# Patient Record
Sex: Male | Born: 1955 | Race: Black or African American | Hispanic: No | Marital: Married | Smoking: Former smoker
Health system: Southern US, Community
[De-identification: ages and names within clinical notes are randomized; demographics above are authoritative.]

## PROBLEM LIST (undated history)

## (undated) DIAGNOSIS — E119 Type 2 diabetes mellitus without complications: Secondary | ICD-10-CM

## (undated) DIAGNOSIS — K5710 Diverticulosis of small intestine without perforation or abscess without bleeding: Secondary | ICD-10-CM

## (undated) DIAGNOSIS — Z86010 Personal history of colonic polyps: Secondary | ICD-10-CM

## (undated) DIAGNOSIS — N400 Enlarged prostate without lower urinary tract symptoms: Secondary | ICD-10-CM

## (undated) DIAGNOSIS — K2980 Duodenitis without bleeding: Secondary | ICD-10-CM

## (undated) DIAGNOSIS — D649 Anemia, unspecified: Secondary | ICD-10-CM

## (undated) DIAGNOSIS — K3580 Unspecified acute appendicitis: Secondary | ICD-10-CM

## (undated) DIAGNOSIS — D509 Iron deficiency anemia, unspecified: Secondary | ICD-10-CM

## (undated) DIAGNOSIS — C61 Malignant neoplasm of prostate: Secondary | ICD-10-CM

## (undated) DIAGNOSIS — E7800 Pure hypercholesterolemia, unspecified: Secondary | ICD-10-CM

## (undated) DIAGNOSIS — K627 Radiation proctitis: Secondary | ICD-10-CM

## (undated) DIAGNOSIS — Z862 Personal history of diseases of the blood and blood-forming organs and certain disorders involving the immune mechanism: Secondary | ICD-10-CM

## (undated) DIAGNOSIS — N529 Male erectile dysfunction, unspecified: Secondary | ICD-10-CM

## (undated) DIAGNOSIS — I10 Essential (primary) hypertension: Secondary | ICD-10-CM

## (undated) HISTORY — DX: Type 2 diabetes mellitus without complications: E11.9

## (undated) HISTORY — DX: Essential (primary) hypertension: I10

## (undated) HISTORY — DX: Malignant neoplasm of prostate: C61

## (undated) HISTORY — DX: Unspecified acute appendicitis: K35.80

## (undated) HISTORY — PX: OTHER SURGICAL HISTORY: SHX169

---

## 2008-10-21 ENCOUNTER — Emergency Department: Payer: Self-pay | Admitting: Internal Medicine

## 2010-09-03 ENCOUNTER — Emergency Department: Payer: Self-pay | Admitting: Emergency Medicine

## 2010-12-17 ENCOUNTER — Emergency Department: Payer: Self-pay | Admitting: Emergency Medicine

## 2011-03-30 ENCOUNTER — Emergency Department: Payer: Self-pay | Admitting: *Deleted

## 2011-06-20 ENCOUNTER — Emergency Department: Payer: Self-pay | Admitting: Emergency Medicine

## 2011-10-01 ENCOUNTER — Emergency Department: Payer: Self-pay | Admitting: Emergency Medicine

## 2012-03-19 ENCOUNTER — Emergency Department: Payer: Self-pay | Admitting: Emergency Medicine

## 2012-03-22 ENCOUNTER — Emergency Department: Payer: Self-pay | Admitting: Emergency Medicine

## 2012-04-19 ENCOUNTER — Ambulatory Visit: Payer: Self-pay | Admitting: Emergency Medicine

## 2012-04-19 LAB — DOT URINE DIP
Glucose,UR: NEGATIVE mg/dL (ref 0–75)
Specific Gravity: 1.025 (ref 1.003–1.030)

## 2012-07-13 ENCOUNTER — Ambulatory Visit: Payer: Self-pay | Admitting: Family Medicine

## 2012-07-13 LAB — DOT URINE DIP
Glucose,UR: NEGATIVE mg/dL (ref 0–75)
Protein: NEGATIVE
Specific Gravity: >=1.03 (ref 1.003–1.030)

## 2013-01-21 ENCOUNTER — Emergency Department: Payer: Self-pay | Admitting: Emergency Medicine

## 2013-01-21 LAB — COMPREHENSIVE METABOLIC PANEL
Anion Gap: 8 (ref 7–16)
BUN: 17 mg/dL (ref 7–18)
Calcium, Total: 9.3 mg/dL (ref 8.5–10.1)
Chloride: 95 mmol/L — ABNORMAL LOW (ref 98–107)
Co2: 24 mmol/L (ref 21–32)
EGFR (African American): >60
EGFR (Non-African Amer.): >60
Glucose: 704 mg/dL (ref 65–99)
Osmolality: 290 (ref 275–301)
Potassium: 4.5 mmol/L (ref 3.5–5.1)
SGOT(AST): 21 U/L (ref 15–37)
SGPT (ALT): 34 U/L (ref 12–78)
Sodium: 127 mmol/L — ABNORMAL LOW (ref 136–145)

## 2013-01-21 LAB — CBC
HCT: 42 % (ref 40.0–52.0)
MCH: 26.6 pg (ref 26.0–34.0)
MCHC: 33.1 g/dL (ref 32.0–36.0)
MCV: 80 fL (ref 80–100)
Platelet: 324 10*3/uL (ref 150–440)
RBC: 5.22 10*6/uL (ref 4.40–5.90)
RDW: 15.5 % — ABNORMAL HIGH (ref 11.5–14.5)
WBC: 10.7 10*3/uL — ABNORMAL HIGH (ref 3.8–10.6)

## 2013-01-21 LAB — URINALYSIS, COMPLETE
Bacteria: NONE SEEN
Glucose,UR: >=500 mg/dL (ref 0–75)
Nitrite: NEGATIVE
Protein: NEGATIVE
RBC,UR: <1 /HPF (ref 0–5)
Specific Gravity: 1.029 (ref 1.003–1.030)
Squamous Epithelial: <1

## 2013-01-21 LAB — CK TOTAL AND CKMB (NOT AT ARMC)
CK, Total: 185 U/L (ref 35–232)
CK-MB: 0.9 ng/mL (ref 0.5–3.6)

## 2013-01-21 LAB — TROPONIN I: Troponin-I: <0.02 ng/mL

## 2013-02-08 HISTORY — PX: COLONOSCOPY: SHX174

## 2013-04-14 DIAGNOSIS — C61 Malignant neoplasm of prostate: Secondary | ICD-10-CM

## 2013-04-14 HISTORY — DX: Malignant neoplasm of prostate: C61

## 2013-05-09 ENCOUNTER — Ambulatory Visit: Payer: Self-pay | Admitting: Radiation Oncology

## 2013-05-15 ENCOUNTER — Ambulatory Visit: Payer: Self-pay | Admitting: Radiation Oncology

## 2013-06-08 LAB — CBC CANCER CENTER
Basophil #: 0 x10 3/mm (ref 0.0–0.1)
Basophil %: 0.5 %
Eosinophil #: 0.1 x10 3/mm (ref 0.0–0.7)
Eosinophil %: 0.9 %
HCT: 37.9 % — ABNORMAL LOW (ref 40.0–52.0)
HGB: 12 g/dL — AB (ref 13.0–18.0)
LYMPHS ABS: 1.5 x10 3/mm (ref 1.0–3.6)
LYMPHS PCT: 28 %
MCH: 25.3 pg — ABNORMAL LOW (ref 26.0–34.0)
MCHC: 31.7 g/dL — AB (ref 32.0–36.0)
MCV: 80 fL (ref 80–100)
Monocyte #: 0.4 x10 3/mm (ref 0.2–1.0)
Monocyte %: 7.2 %
NEUTROS PCT: 63.4 %
Neutrophil #: 3.4 x10 3/mm (ref 1.4–6.5)
Platelet: 405 x10 3/mm (ref 150–440)
RBC: 4.75 10*6/uL (ref 4.40–5.90)
RDW: 16.2 % — ABNORMAL HIGH (ref 11.5–14.5)
WBC: 5.4 x10 3/mm (ref 3.8–10.6)

## 2013-06-12 ENCOUNTER — Ambulatory Visit: Payer: Self-pay | Admitting: Radiation Oncology

## 2013-06-15 LAB — CBC CANCER CENTER
Basophil #: 0 x10 3/mm (ref 0.0–0.1)
Basophil %: 0.6 %
EOS PCT: 1.1 %
Eosinophil #: 0 x10 3/mm (ref 0.0–0.7)
HCT: 37.8 % — ABNORMAL LOW (ref 40.0–52.0)
HGB: 12 g/dL — AB (ref 13.0–18.0)
LYMPHS PCT: 23 %
Lymphocyte #: 0.9 x10 3/mm — ABNORMAL LOW (ref 1.0–3.6)
MCH: 25.3 pg — AB (ref 26.0–34.0)
MCHC: 31.8 g/dL — ABNORMAL LOW (ref 32.0–36.0)
MCV: 80 fL (ref 80–100)
Monocyte #: 0.3 x10 3/mm (ref 0.2–1.0)
Monocyte %: 6.8 %
Neutrophil #: 2.8 x10 3/mm (ref 1.4–6.5)
Neutrophil %: 68.5 %
PLATELETS: 329 x10 3/mm (ref 150–440)
RBC: 4.74 10*6/uL (ref 4.40–5.90)
RDW: 16.9 % — AB (ref 11.5–14.5)
WBC: 4.1 x10 3/mm (ref 3.8–10.6)

## 2013-06-22 LAB — CBC CANCER CENTER
BASOS ABS: 0 x10 3/mm (ref 0.0–0.1)
BASOS PCT: 0.4 %
EOS PCT: 1.7 %
Eosinophil #: 0.1 x10 3/mm (ref 0.0–0.7)
HCT: 37.6 % — ABNORMAL LOW (ref 40.0–52.0)
HGB: 11.9 g/dL — AB (ref 13.0–18.0)
Lymphocyte #: 0.8 x10 3/mm — ABNORMAL LOW (ref 1.0–3.6)
Lymphocyte %: 20.2 %
MCH: 25 pg — AB (ref 26.0–34.0)
MCHC: 31.6 g/dL — AB (ref 32.0–36.0)
MCV: 79 fL — ABNORMAL LOW (ref 80–100)
MONO ABS: 0.4 x10 3/mm (ref 0.2–1.0)
Monocyte %: 10.3 %
Neutrophil #: 2.6 x10 3/mm (ref 1.4–6.5)
Neutrophil %: 67.4 %
Platelet: 220 x10 3/mm (ref 150–440)
RBC: 4.76 10*6/uL (ref 4.40–5.90)
RDW: 17.1 % — AB (ref 11.5–14.5)
WBC: 3.8 x10 3/mm (ref 3.8–10.6)

## 2013-06-29 LAB — CBC CANCER CENTER
BASOS ABS: 0 x10 3/mm (ref 0.0–0.1)
BASOS PCT: 0.4 %
EOS ABS: 0.1 x10 3/mm (ref 0.0–0.7)
Eosinophil %: 2 %
HCT: 37.9 % — AB (ref 40.0–52.0)
HGB: 12 g/dL — ABNORMAL LOW (ref 13.0–18.0)
Lymphocyte #: 0.7 x10 3/mm — ABNORMAL LOW (ref 1.0–3.6)
Lymphocyte %: 20 %
MCH: 25.1 pg — ABNORMAL LOW (ref 26.0–34.0)
MCHC: 31.6 g/dL — ABNORMAL LOW (ref 32.0–36.0)
MCV: 80 fL (ref 80–100)
MONOS PCT: 12.5 %
Monocyte #: 0.5 x10 3/mm (ref 0.2–1.0)
NEUTROS ABS: 2.4 x10 3/mm (ref 1.4–6.5)
Neutrophil %: 65.1 %
PLATELETS: 193 x10 3/mm (ref 150–440)
RBC: 4.77 10*6/uL (ref 4.40–5.90)
RDW: 17.3 % — ABNORMAL HIGH (ref 11.5–14.5)
WBC: 3.7 x10 3/mm — AB (ref 3.8–10.6)

## 2013-07-13 ENCOUNTER — Ambulatory Visit: Payer: Self-pay | Admitting: Radiation Oncology

## 2013-08-22 ENCOUNTER — Ambulatory Visit: Payer: Self-pay | Admitting: Radiation Oncology

## 2013-09-02 ENCOUNTER — Ambulatory Visit: Payer: Self-pay

## 2013-09-02 LAB — DOT URINE DIP
GLUCOSE, UR: NEGATIVE mg/dL (ref 0–75)
Protein: NEGATIVE
SPECIFIC GRAVITY: 1.025 (ref 1.003–1.030)

## 2013-09-12 ENCOUNTER — Ambulatory Visit: Payer: Self-pay | Admitting: Radiation Oncology

## 2013-09-22 LAB — CBC CANCER CENTER
Basophil #: 0 x10 3/mm (ref 0.0–0.1)
Basophil %: 0.7 %
Eosinophil #: 0.1 x10 3/mm (ref 0.0–0.7)
Eosinophil %: 1.5 %
HCT: 35.4 % — ABNORMAL LOW (ref 40.0–52.0)
HGB: 11.5 g/dL — AB (ref 13.0–18.0)
Lymphocyte #: 0.9 x10 3/mm — ABNORMAL LOW (ref 1.0–3.6)
Lymphocyte %: 20.4 %
MCH: 26.4 pg (ref 26.0–34.0)
MCHC: 32.4 g/dL (ref 32.0–36.0)
MCV: 82 fL (ref 80–100)
Monocyte #: 0.4 x10 3/mm (ref 0.2–1.0)
Monocyte %: 10.4 %
NEUTROS ABS: 2.8 x10 3/mm (ref 1.4–6.5)
Neutrophil %: 67 %
Platelet: 294 x10 3/mm (ref 150–440)
RBC: 4.35 10*6/uL — AB (ref 4.40–5.90)
RDW: 17.1 % — ABNORMAL HIGH (ref 11.5–14.5)
WBC: 4.2 x10 3/mm (ref 3.8–10.6)

## 2013-09-29 LAB — CBC CANCER CENTER
BASOS PCT: 0.6 %
Basophil #: 0 x10 3/mm (ref 0.0–0.1)
Eosinophil #: 0.1 x10 3/mm (ref 0.0–0.7)
Eosinophil %: 1.4 %
HCT: 34.5 % — ABNORMAL LOW (ref 40.0–52.0)
HGB: 11.3 g/dL — ABNORMAL LOW (ref 13.0–18.0)
Lymphocyte #: 0.9 x10 3/mm — ABNORMAL LOW (ref 1.0–3.6)
Lymphocyte %: 22.3 %
MCH: 26.6 pg (ref 26.0–34.0)
MCHC: 32.7 g/dL (ref 32.0–36.0)
MCV: 81 fL (ref 80–100)
Monocyte #: 0.4 x10 3/mm (ref 0.2–1.0)
Monocyte %: 9.5 %
NEUTROS ABS: 2.6 x10 3/mm (ref 1.4–6.5)
Neutrophil %: 66.2 %
Platelet: 289 x10 3/mm (ref 150–440)
RBC: 4.24 10*6/uL — ABNORMAL LOW (ref 4.40–5.90)
RDW: 16.8 % — ABNORMAL HIGH (ref 11.5–14.5)
WBC: 3.9 x10 3/mm (ref 3.8–10.6)

## 2013-10-12 ENCOUNTER — Ambulatory Visit: Payer: Self-pay | Admitting: Radiation Oncology

## 2014-02-24 ENCOUNTER — Ambulatory Visit: Payer: Self-pay | Admitting: Radiation Oncology

## 2014-02-27 LAB — PSA: PSA: 1.7

## 2014-03-01 LAB — PSA: PSA: 1.7 ng/mL (ref 0.0–4.0)

## 2014-03-14 ENCOUNTER — Ambulatory Visit: Payer: Self-pay | Admitting: Radiation Oncology

## 2014-05-16 ENCOUNTER — Encounter: Payer: Self-pay | Admitting: General Surgery

## 2014-05-16 ENCOUNTER — Ambulatory Visit (INDEPENDENT_AMBULATORY_CARE_PROVIDER_SITE_OTHER): Payer: BLUE CROSS/BLUE SHIELD | Admitting: General Surgery

## 2014-05-16 VITALS — BP 122/78 | HR 82 | Resp 16 | Ht 72.0 in | Wt 212.0 lb

## 2014-05-16 DIAGNOSIS — K610 Anal abscess: Secondary | ICD-10-CM

## 2014-05-16 NOTE — Patient Instructions (Signed)
Follow up as scheduled.  

## 2014-05-16 NOTE — Progress Notes (Signed)
Patient ID: Luis Aguilar, male   DOB: 02/17/1956, 59 y.o.   MRN: [id number]  Chief Complaint  Patient presents with  . Other    pilonidal cyst    HPI Luis Aguilar is a 59 y.o. male. here today for a evalation of a possible infected pilonidal cyst. States the pain started Friday, no drainage noted. Started antibiotics yesterday. He has never had issues like this before.   HPI  Past Medical History  Diagnosis Date  . Hypertension   . Diabetes mellitus without complication   . Cancer 2015    prostate radiation    No past surgical history on file.  No family history on file.  Social History History  Substance Use Topics  . Smoking status: Former Smoker -- 10 years    Quit date: 04/14/2002  . Smokeless tobacco: Never Used  . Alcohol Use: No    No Known Allergies  Current Outpatient Prescriptions  Medication Sig Dispense Refill  . aspirin 81 MG tablet Take 81 mg by mouth daily.    Marland Kitchen atorvastatin (LIPITOR) 20 MG tablet Take 20 mg by mouth daily.    Marland Kitchen doxycycline (VIBRAMYCIN) 100 MG capsule Take 100 mg by mouth daily.    . enalapril (VASOTEC) 5 MG tablet Take 5 mg by mouth daily.    Marland Kitchen HYDROcodone-acetaminophen (NORCO/VICODIN) 5-325 MG per tablet Take 1 tablet by mouth every 6 (six) hours as needed for moderate pain.    . metFORMIN (GLUCOPHAGE) 500 MG tablet Take 500 mg by mouth 2 (two) times daily with a meal.    . tamsulosin (FLOMAX) 0.4 MG CAPS capsule Take 0.4 mg by mouth.     No current facility-administered medications for this visit.    Review of Systems Review of Systems  Constitutional: Negative.   Respiratory: Negative.   Cardiovascular: Negative.     Blood pressure 122/78, pulse 82, resp. rate 16, height 6' (1.829 m), weight 212 lb (96.163 kg).  Physical Exam Physical Exam  Constitutional: He is oriented to person, place, and time. He appears well-developed and well-nourished.  Eyes: Conjunctivae are normal. No scleral icterus.  Genitourinary:      Neurological: He is alert and oriented to person, place, and time.  Skin: Skin is warm and dry.    Data Reviewed Office notes  Assessment    Left perianal abscess.    Plan    Incision and drainage. Discussed with pt and he is agreeable. Procedure complated.  Betadine prep. 8 ml 1% xylocaine mixed with 0.5% marcaine used.  Cruciate incision made and 10-26ml thick pus with odor drained. C/S done. Area dressed with 4/4s.  Procedure tolerated with minimal discomfort. Pt felt pain markedly reduced after drainage.   Follow up in 2 weeks.       SANKAR,SEEPLAPUTHUR G 05/16/2014, 2:11 PM

## 2014-05-18 ENCOUNTER — Telehealth: Payer: Self-pay | Admitting: *Deleted

## 2014-05-18 NOTE — Telephone Encounter (Signed)
Called to say they need refill on pain medication, Norco 5/325mg .

## 2014-05-21 LAB — ANAEROBIC AND AEROBIC CULTURE

## 2014-05-30 ENCOUNTER — Ambulatory Visit (INDEPENDENT_AMBULATORY_CARE_PROVIDER_SITE_OTHER): Payer: BLUE CROSS/BLUE SHIELD | Admitting: General Surgery

## 2014-05-30 ENCOUNTER — Encounter: Payer: Self-pay | Admitting: General Surgery

## 2014-05-30 VITALS — BP 100/72 | HR 76 | Resp 12 | Ht 72.0 in | Wt 212.0 lb

## 2014-05-30 DIAGNOSIS — K610 Anal abscess: Secondary | ICD-10-CM

## 2014-05-30 NOTE — Progress Notes (Signed)
Patient ID: Luis Aguilar, male   DOB: 08-04-1955, 59 y.o.   MRN: [id number]  The patient presents for a 2 week follow up of a rectal abscess. He states the area is still draining a little bit. He denies pain at this time. He has completed his antibiotics and pain medication.   Mild induration noted at the 5 o'clock position. No skin opening. Overall remarkably improved. Patient advised of potential fistula.   Patient to return in 1 month for follow up.

## 2014-05-30 NOTE — Patient Instructions (Signed)
Patient to return in 1 month for follow up. The patient is aware to call back for any questions or concerns.  

## 2014-06-07 ENCOUNTER — Encounter: Payer: Self-pay | Admitting: *Deleted

## 2014-06-29 ENCOUNTER — Ambulatory Visit (INDEPENDENT_AMBULATORY_CARE_PROVIDER_SITE_OTHER): Payer: BLUE CROSS/BLUE SHIELD | Admitting: General Surgery

## 2014-06-29 ENCOUNTER — Encounter: Payer: Self-pay | Admitting: General Surgery

## 2014-06-29 DIAGNOSIS — K603 Anal fistula: Secondary | ICD-10-CM | POA: Diagnosis not present

## 2014-06-29 NOTE — Progress Notes (Deleted)
T

## 2014-06-29 NOTE — Patient Instructions (Addendum)
The patient is aware to call back for any questions or concerns.  Patient's surgery has been scheduled for 07-20-14 at [facility].

## 2014-06-29 NOTE — Progress Notes (Signed)
Patient ID: Luis Aguilar, male   DOB: 1955-10-18, 59 y.o.   MRN: [id number]  Chief Complaint  Patient presents with  . Follow-up    HPI Luis Aguilar is a 59 y.o. male he patient presents for a follow up of a rectal abscess. He states the area is still draining a little bit. He denies pain, only tenderness. He has noticed occasionally bleeding with BM.    HPI  Past Medical History  Diagnosis Date  . Hypertension   . Diabetes mellitus without complication   . Cancer 2015    prostate radiation    History reviewed. No pertinent past surgical history.  History reviewed. No pertinent family history.  Social History History  Substance Use Topics  . Smoking status: Former Smoker -- 10 years    Quit date: 04/14/2002  . Smokeless tobacco: Never Used  . Alcohol Use: No    No Known Allergies  Current Outpatient Prescriptions  Medication Sig Dispense Refill  . aspirin 81 MG tablet Take 81 mg by mouth daily.    Marland Kitchen atorvastatin (LIPITOR) 20 MG tablet Take 20 mg by mouth daily.    . enalapril (VASOTEC) 5 MG tablet Take 5 mg by mouth daily.    . metFORMIN (GLUCOPHAGE) 500 MG tablet Take 500 mg by mouth 2 (two) times daily with a meal.    . tamsulosin (FLOMAX) 0.4 MG CAPS capsule Take 0.4 mg by mouth.     No current facility-administered medications for this visit.    Review of Systems Review of Systems  Blood pressure 130/74, pulse 88, resp. rate 14, height 5\' 11"  (1.803 m), weight 213 lb (96.616 kg).  Physical Exam Physical Exam  Constitutional: He appears well-developed and well-nourished.  Eyes: Conjunctivae are normal. No scleral icterus.  Cardiovascular: Normal rate, regular rhythm and normal heart sounds.   Pulmonary/Chest: Effort normal and breath sounds normal.  Genitourinary:     Skin: Skin is warm and dry.    Data Reviewed Prior note  Assessment    Anal fistula    Plan    Discussed the nature of the problem and the surgical options. Fistulotomy  or use of fibrin plug discussed. Patient is agreeable.     Patient's surgery has been scheduled for 07-20-14 at [facility].   Evea Sheek G 07/03/2014, 3:32 PM

## 2014-07-03 ENCOUNTER — Encounter: Payer: Self-pay | Admitting: General Surgery

## 2014-07-11 ENCOUNTER — Other Ambulatory Visit: Payer: Self-pay | Admitting: General Surgery

## 2014-07-11 DIAGNOSIS — K603 Anal fistula: Secondary | ICD-10-CM

## 2014-07-17 ENCOUNTER — Ambulatory Visit
Admit: 2014-07-17 | Disposition: A | Discharge status: Home / Self Care | Payer: Self-pay | Attending: Anesthesiology | Admitting: Anesthesiology

## 2014-07-17 LAB — BASIC METABOLIC PANEL
Anion Gap: 9 (ref 7–16)
BUN: 20 mg/dL
CHLORIDE: 106 mmol/L
CREATININE: 0.9 mg/dL
Calcium, Total: 9.2 mg/dL
Co2: 24 mmol/L
EGFR (Non-African Amer.): >60
Glucose: 150 mg/dL — ABNORMAL HIGH
Potassium: 3.8 mmol/L
SODIUM: 139 mmol/L

## 2014-07-18 ENCOUNTER — Telehealth: Payer: Self-pay

## 2014-07-18 NOTE — Telephone Encounter (Signed)
Heather with pre admit testing at [facility] called and said that the patient needs medical clearance for his surgery scheduled for 07/20/14. She faxed the release form and EKG to them along with a note to his PCP Dr Sandi Raveling see if the patient could be cleared for surgery or if he would need to be seen first. I called Dr Lynden Oxford office and left a message for them to contact us if the patient was able to be cleared or not.

## 2014-07-18 NOTE — Telephone Encounter (Signed)
Luis Aguilar with Dr Lynden Oxford office called and said that the patient was at low risk for surgery and would sign and fax back the clearance to pre admit testing.

## 2014-07-20 ENCOUNTER — Ambulatory Visit
Admit: 2014-07-20 | Disposition: A | Discharge status: Home / Self Care | Payer: Self-pay | Attending: General Surgery | Admitting: General Surgery

## 2014-07-20 DIAGNOSIS — K603 Anal fistula: Secondary | ICD-10-CM | POA: Diagnosis not present

## 2014-07-20 HISTORY — PX: ANAL FISTULOTOMY: SHX6423

## 2014-07-21 ENCOUNTER — Telehealth: Payer: Self-pay | Admitting: General Surgery

## 2014-07-21 NOTE — Telephone Encounter (Signed)
PT CALLED STATING HE NEEDS TO KNOW WHEN HE CAN RETURN TO WORK.HE HAD SURGERY 07-20-14(ANAL FISTULOTOMY). P/O VISIT SCHEDULED FOR 07-27-14.

## 2014-07-24 ENCOUNTER — Encounter: Payer: Self-pay | Admitting: General Surgery

## 2014-07-27 ENCOUNTER — Ambulatory Visit: Payer: BLUE CROSS/BLUE SHIELD | Admitting: General Surgery

## 2014-07-31 ENCOUNTER — Encounter: Payer: Self-pay | Admitting: General Surgery

## 2014-07-31 ENCOUNTER — Ambulatory Visit (INDEPENDENT_AMBULATORY_CARE_PROVIDER_SITE_OTHER): Payer: BLUE CROSS/BLUE SHIELD | Admitting: General Surgery

## 2014-07-31 VITALS — BP 130/68 | HR 70 | Resp 12 | Ht 73.0 in | Wt 213.0 lb

## 2014-07-31 DIAGNOSIS — K603 Anal fistula: Secondary | ICD-10-CM

## 2014-07-31 NOTE — Progress Notes (Signed)
This is a 59 year old male here today for his post op fistulotomy done on 07/20/14. 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 Fistula was filled with fibrin glue. Patient states he is doing well.  No opening or drainage noted on exam.  Follow up in 4-6 weeks.

## 2014-07-31 NOTE — Patient Instructions (Signed)
Follow up in 4-6 weeks. No limitations.

## 2014-08-01 ENCOUNTER — Encounter: Payer: Self-pay | Admitting: General Surgery
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
## 2014-08-13 NOTE — Op Note (Signed)
PATIENT NAME:  Luis Aguilar, Luis Aguilar MR#:  [id number] DATE OF BIRTH:  [birth year]  DATE OF PROCEDURE:  07/20/2014  PREOPERATIVE DIAGNOSIS: Anal fistula.   POSTOPERATIVE DIAGNOSIS: Anal fistula, intersphincteric.   OPERATION PERFORMED: Treatment of fistula with fibrin glue.   SURGEON: Mckinley Jewel, M.D.   ANESTHESIA: General.   COMPLICATIONS: None.   ESTIMATED BLOOD LOSS: Minimal.   DRAINS: None.   DESCRIPTION OF PROCEDURE: The patient was placed in the supine on the operating room table and put to sleep with a LMA. He was then placed in the lithotomy position. The anal area was prepped and draped out as a sterile field. The patient had an external opening located at about the 5 o'clock location about 2 cm from the anal opening. Digital exam of the rectum showed no finding. Speculum exam likewise showed no evidence of hemorrhoids or fissures. A probe was then inserted through the external opening and negotiated into the internal opening located about 2 cm within the anal canal and this appeared to be going in between the sphincter muscles and was not suitable for fistulotomy. It was felt that this was a fairly long tract and therefore it was decided to treat this with fibrin glue; 2 mL of Evicel was then brought up to the field. The tract was scraped off with a curette and then filled with 2 mL of Evicel. The internal and the external openings were closed with a simple stitch of 3-0 Vicryl. Then 20 mL of Exparel was instilled around the anal area and around the tract to help with postop analgesia. The patient tolerated the procedure well. He was subsequently returned to the recovery room in stable condition.   ____________________________ S.Robinette Haines, MD sgs:sb D: 07/20/2014 09:12:10 ET T: 07/20/2014 12:30:25 ET JOB#: [id number]  cc: Synthia Innocent. Jamal Collin, MD, <Dictator> [facility] Robinette Haines MD ELECTRONICALLY SIGNED 07/24/2014 10:10

## 2014-08-18 ENCOUNTER — Ambulatory Visit
Admission: EM | Admit: 2014-08-18 | Discharge: 2014-08-18 | Disposition: A | Discharge status: Home / Self Care | Payer: Self-pay | Attending: Internal Medicine | Admitting: Internal Medicine

## 2014-08-18 ENCOUNTER — Encounter: Payer: Self-pay | Admitting: Emergency Medicine

## 2014-08-18 DIAGNOSIS — Z024 Encounter for examination for driving license: Secondary | ICD-10-CM | POA: Insufficient documentation

## 2014-08-18 DIAGNOSIS — Z0000 Encounter for general adult medical examination without abnormal findings: Secondary | ICD-10-CM

## 2014-08-18 DIAGNOSIS — Z87891 Personal history of nicotine dependence: Secondary | ICD-10-CM | POA: Insufficient documentation

## 2014-08-18 DIAGNOSIS — E119 Type 2 diabetes mellitus without complications: Secondary | ICD-10-CM | POA: Insufficient documentation

## 2014-08-18 DIAGNOSIS — Z029 Encounter for administrative examinations, unspecified: Secondary | ICD-10-CM

## 2014-08-18 DIAGNOSIS — Z79899 Other long term (current) drug therapy: Secondary | ICD-10-CM | POA: Insufficient documentation

## 2014-08-18 DIAGNOSIS — I10 Essential (primary) hypertension: Secondary | ICD-10-CM | POA: Insufficient documentation

## 2014-08-18 LAB — URINALYSIS COMPLETE WITH MICROSCOPIC (ARMC ONLY)
BACTERIA UA: NONE SEEN — AB
Bilirubin Urine: NEGATIVE
Glucose, UA: NEGATIVE mg/dL
KETONES UR: NEGATIVE mg/dL
Leukocytes, UA: NEGATIVE
NITRITE: NEGATIVE
PH: 5.5 (ref 5.0–8.0)
PROTEIN: NEGATIVE mg/dL
Specific Gravity, Urine: 1.02 (ref 1.005–1.030)
WBC UA: NONE SEEN WBC/hpf (ref <3)

## 2014-08-18 LAB — DEPT OF TRANSP DIPSTICK, URINE (ARMC ONLY)
GLUCOSE, UA: NEGATIVE mg/dL
Protein, ur: NEGATIVE mg/dL
Specific Gravity, Urine: 1.025 (ref 1.005–1.030)
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
## 2014-08-18 NOTE — ED Notes (Signed)
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

## 2014-08-25 ENCOUNTER — Other Ambulatory Visit: Payer: Self-pay | Admitting: *Deleted

## 2014-08-25 DIAGNOSIS — C61 Malignant neoplasm of prostate: Secondary | ICD-10-CM

## 2014-08-28 ENCOUNTER — Ambulatory Visit (INDEPENDENT_AMBULATORY_CARE_PROVIDER_SITE_OTHER): Payer: BLUE CROSS/BLUE SHIELD | Admitting: General Surgery

## 2014-08-28 VITALS — BP 122/60 | HR 70 | Resp 12 | Ht 73.0 in | Wt 212.0 lb

## 2014-08-28 DIAGNOSIS — K603 Anal fistula: Secondary | ICD-10-CM

## 2014-08-28 NOTE — Patient Instructions (Signed)
Patient to return ad needed.

## 2014-08-28 NOTE — Progress Notes (Signed)
This is a 59 year old male here today for his follow up fistulotomy done on 07/20/14. 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 Fistula was filled with fibrin glue. Patient states he is doing well.  Exam shows no external opening and no palpable ridge or thickening.  Fistula has healed fully.  Patient to return as needed.

## 2014-08-29 ENCOUNTER — Encounter: Payer: Self-pay | Admitting: General Surgery

## 2014-09-04 ENCOUNTER — Ambulatory Visit
Admission: RE | Admit: 2014-09-04 | Discharge: 2014-09-04 | Disposition: A | Discharge status: Home / Self Care | Payer: BLUE CROSS/BLUE SHIELD | Source: Ambulatory Visit | Attending: Radiation Oncology | Admitting: Radiation Oncology

## 2014-09-04 ENCOUNTER — Inpatient Hospital Stay: Payer: BLUE CROSS/BLUE SHIELD | Attending: Radiation Oncology

## 2014-09-04 ENCOUNTER — Encounter: Payer: Self-pay | Admitting: Radiation Oncology

## 2014-09-04 VITALS — BP 145/92 | HR 85 | Temp 96.8°F | Resp 20 | Wt 213.4 lb

## 2014-09-04 DIAGNOSIS — C61 Malignant neoplasm of prostate: Secondary | ICD-10-CM | POA: Diagnosis not present

## 2014-09-04 LAB — PSA: PSA: 2.33 ng/mL (ref 0.00–4.00)

## 2014-09-04 NOTE — Progress Notes (Signed)
Radiation Oncology Follow up Note  Name: Luis Aguilar   Date:   09/04/2014 MRN:  [id number] DOB: 18-Jul-1955    This 59 y.o. male presents to the clinic today for prostate cancer stage IIB (T1 CN 0 M0) presenting with a Gleason score of 7 (3+4) and a PSA of 32.  REFERRING PROVIDER: Dion Body, MD  HPI: Patient is a 59 year old male now out 11 months having completed image guided radiation therapy to 8000 cGy using I am RT technique. He presented with a PSA of 32 and clinical stage IIB. We treated his prostate and pelvic nodes. He is seen today in routine follow-up and is doing well specifically denies any lower urinary tract symptoms diarrhea or dysuria. His last PSA back in November 2015 was 1.7. We've repeated PSA testing today.  COMPLICATIONS OF TREATMENT: none  FOLLOW UP COMPLIANCE: keeps appointments   PHYSICAL EXAM:  BP 145/92 mmHg  Pulse 85  Temp(Src) 96.8 F (36 C)  Resp 20  Wt 213 lb 6.5 oz (96.8 kg) On rectal exam rectal sphincter tone is good. Prostate is smooth contracted without evidence of nodularity or mass. Sulcus is preserved bilaterally. No discrete nodularity is identified. No other rectal abnormalities are noted. Well-developed well-nourished patient in NAD. HEENT reveals PERLA, EOMI, discs not visualized.  Oral cavity is clear. No oral mucosal lesions are identified. Neck is clear without evidence of cervical or supraclavicular adenopathy. Lungs are clear to A&P. Cardiac examination is essentially unremarkable with regular rate and rhythm without murmur rub or thrill. Abdomen is benign with no organomegaly or masses noted. Motor sensory and DTR levels are equal and symmetric in the upper and lower extremities. Cranial nerves II through XII are grossly intact. Proprioception is intact. No peripheral adenopathy or edema is identified. No motor or sensory levels are noted. Crude visual fields are within normal range.   RADIOLOGY RESULTS: No radiology reports to  review  PLAN: At the present time he is doing well I've run a PSA level on him today and will report that separately. Should that be stable will see him back in 1 year for follow-up. Patient continues to do extremely well. He knows to call sooner with any concerns.  I would like to take this opportunity for allowing me to participate in the care of your patient.Armstead Peaks., MD

## 2014-11-08 ENCOUNTER — Encounter: Payer: Self-pay | Admitting: Registered Nurse

## 2014-11-08 ENCOUNTER — Ambulatory Visit
Admission: EM | Admit: 2014-11-08 | Discharge: 2014-11-08 | Disposition: A | Discharge status: Home / Self Care | Payer: BLUE CROSS/BLUE SHIELD | Attending: Family Medicine | Admitting: Family Medicine

## 2014-11-08 DIAGNOSIS — E663 Overweight: Secondary | ICD-10-CM

## 2014-11-08 DIAGNOSIS — Z024 Encounter for examination for driving license: Secondary | ICD-10-CM

## 2014-11-08 DIAGNOSIS — H6121 Impacted cerumen, right ear: Secondary | ICD-10-CM

## 2014-11-08 DIAGNOSIS — E119 Type 2 diabetes mellitus without complications: Secondary | ICD-10-CM

## 2014-11-08 DIAGNOSIS — I10 Essential (primary) hypertension: Secondary | ICD-10-CM

## 2014-11-08 DIAGNOSIS — Z029 Encounter for administrative examinations, unspecified: Secondary | ICD-10-CM

## 2014-11-08 LAB — DEPT OF TRANSP DIPSTICK, URINE (ARMC ONLY)
Glucose, UA: NEGATIVE mg/dL
Protein, ur: NEGATIVE mg/dL
Specific Gravity, Urine: 1.015 (ref 1.005–1.030)

## 2014-11-08 NOTE — Discharge Instructions (Signed)
DASH Eating Plan °DASH stands for "Dietary Approaches to Stop Hypertension." The DASH eating plan is a healthy eating plan that has been shown to reduce high blood pressure (hypertension). Additional health benefits may include reducing the risk of type 2 diabetes mellitus, heart disease, and stroke. The DASH eating plan may also help with weight loss. °WHAT DO I NEED TO KNOW ABOUT THE DASH EATING PLAN? °For the DASH eating plan, you will follow these general guidelines: °· Choose foods with a percent daily value for sodium of less than 5% (as listed on the food label). °· Use salt-free seasonings or herbs instead of table salt or sea salt. °· Check with your health care provider or pharmacist before using salt substitutes. °· Eat lower-sodium products, often labeled as "lower sodium" or "no salt added." °· Eat fresh foods. °· Eat more vegetables, fruits, and low-fat dairy products. °· Choose whole grains. Look for the word "whole" as the first word in the ingredient list. °· Choose fish and skinless chicken or turkey more often than red meat. Limit fish, poultry, and meat to 6 oz (170 g) each day. °· Limit sweets, desserts, sugars, and sugary drinks. °· Choose heart-healthy fats. °· Limit cheese to 1 oz (28 g) per day. °· Eat more home-cooked food and less restaurant, buffet, and fast food. °· Limit fried foods. °· Cook foods using methods other than frying. °· Limit canned vegetables. If you do use them, rinse them well to decrease the sodium. °· When eating at a restaurant, ask that your food be prepared with less salt, or no salt if possible. °WHAT FOODS CAN I EAT? °Seek help from a dietitian for individual calorie needs. °Grains °Whole grain or whole wheat bread. Brown rice. Whole grain or whole wheat pasta. Quinoa, bulgur, and whole grain cereals. Low-sodium cereals. Corn or whole wheat flour tortillas. Whole grain cornbread. Whole grain crackers. Low-sodium crackers. °Vegetables °Fresh or frozen vegetables  (raw, steamed, roasted, or grilled). Low-sodium or reduced-sodium tomato and vegetable juices. Low-sodium or reduced-sodium tomato sauce and paste. Low-sodium or reduced-sodium canned vegetables.  °Fruits °All fresh, canned (in natural juice), or frozen fruits. °Meat and Other Protein Products °Ground beef (85% or leaner), grass-fed beef, or beef trimmed of fat. Skinless chicken or turkey. Ground chicken or turkey. Pork trimmed of fat. All fish and seafood. Eggs. Dried beans, peas, or lentils. Unsalted nuts and seeds. Unsalted canned beans. °Dairy °Low-fat dairy products, such as skim or 1% milk, 2% or reduced-fat cheeses, low-fat ricotta or cottage cheese, or plain low-fat yogurt. Low-sodium or reduced-sodium cheeses. °Fats and Oils °Tub margarines without trans fats. Light or reduced-fat mayonnaise and salad dressings (reduced sodium). Avocado. Safflower, olive, or canola oils. Natural peanut or almond butter. °Other °Unsalted popcorn and pretzels. °The items listed above may not be a complete list of recommended foods or beverages. Contact your dietitian for more options. °WHAT FOODS ARE NOT RECOMMENDED? °Grains °White bread. White pasta. White rice. Refined cornbread. Bagels and croissants. Crackers that contain trans fat. °Vegetables °Creamed or fried vegetables. Vegetables in a cheese sauce. Regular canned vegetables. Regular canned tomato sauce and paste. Regular tomato and vegetable juices. °Fruits °Dried fruits. Canned fruit in light or heavy syrup. Fruit juice. °Meat and Other Protein Products °Fatty cuts of meat. Ribs, chicken wings, bacon, sausage, bologna, salami, chitterlings, fatback, hot dogs, bratwurst, and packaged luncheon meats. Salted nuts and seeds. Canned beans with salt. °Dairy °Whole or 2% milk, cream, half-and-half, and cream cheese. Whole-fat or sweetened yogurt. Full-fat   cheeses or blue cheese. Nondairy creamers and whipped toppings. Processed cheese, cheese spreads, or cheese  curds. Condiments Onion and garlic salt, seasoned salt, table salt, and sea salt. Canned and packaged gravies. Worcestershire sauce. Tartar sauce. Barbecue sauce. Teriyaki sauce. Soy sauce, including reduced sodium. Steak sauce. Fish sauce. Oyster sauce. Cocktail sauce. Horseradish. Ketchup and mustard. Meat flavorings and tenderizers. Bouillon cubes. Hot sauce. Tabasco sauce. Marinades. Taco seasonings. Relishes. Fats and Oils Butter, stick margarine, lard, shortening, ghee, and bacon fat. Coconut, palm kernel, or palm oils. Regular salad dressings. Other Pickles and olives. Salted popcorn and pretzels. The items listed above may not be a complete list of foods and beverages to avoid. Contact your dietitian for more information. WHERE CAN I FIND MORE INFORMATION? National [employer], [employer], [employer]: travelstabloid.com Document Released: 03/20/2011 Document Revised: 08/15/2013 Document Reviewed: 02/02/2013 [facility] Patient Information 2015 [place], [place]. This information is not intended to replace advice given to you by your health care provider. Make sure you discuss any questions you have with your health care provider.  Cerumen Impaction A cerumen impaction is when the wax in your ear forms a plug. This plug usually causes reduced hearing. Sometimes it also causes an earache or dizziness. Removing a cerumen impaction can be difficult and painful. The wax sticks to the ear canal. The canal is sensitive and bleeds easily. If you try to remove a heavy wax buildup with a cotton tipped swab, you may push it in further. Irrigation with water, suction, and small ear curettes may be used to clear out the wax. If the impaction is fixed to the skin in the ear canal, ear drops may be needed for a few days to loosen the wax. People who build up a lot of wax frequently can use ear wax removal products available in your local drugstore. SEEK MEDICAL CARE IF:  You  develop an earache, increased hearing loss, or marked dizziness. Document Released: 05/08/2004 Document Revised: 06/23/2011 Document Reviewed: 06/28/2009 [facility] Patient Information 2015 [place], [place]. This information is not intended to replace advice given to you by your health care provider. Make sure you discuss any questions you have with your health care provider. Hypertension Hypertension, commonly called high blood pressure, is when the force of blood pumping through your arteries is too strong. Your arteries are the blood vessels that carry blood from your heart throughout your body. A blood pressure reading consists of a higher number over a lower number, such as 110/72. The higher number (systolic) is the pressure inside your arteries when your heart pumps. The lower number (diastolic) is the pressure inside your arteries when your heart relaxes. Ideally you want your blood pressure below 120/80. Hypertension forces your heart to work harder to pump blood. Your arteries may become narrow or stiff. Having hypertension puts you at risk for heart disease, stroke, and other problems.  RISK FACTORS Some risk factors for high blood pressure are controllable. Others are not.  Risk factors you cannot control include:   Race. You may be at higher risk if you are African American.  Age. Risk increases with age.  Gender. Men are at higher risk than women before age 42 years. After age 10, women are at higher risk than men. Risk factors you can control include:  Not getting enough exercise or physical activity.  Being overweight.  Getting too much fat, sugar, calories, or salt in your diet.  Drinking too much alcohol. SIGNS AND SYMPTOMS Hypertension does not usually cause signs or  symptoms. Extremely high blood pressure (hypertensive crisis) may cause headache, anxiety, shortness of breath, and nosebleed. DIAGNOSIS  To check if you have hypertension, your health care provider will  measure your blood pressure while you are seated, with your arm held at the level of your heart. It should be measured at least twice using the same arm. Certain conditions can cause a difference in blood pressure between your right and left arms. A blood pressure reading that is higher than normal on one occasion does not mean that you need treatment. If one blood pressure reading is high, ask your health care provider about having it checked again. TREATMENT  Treating high blood pressure includes making lifestyle changes and possibly taking medicine. Living a healthy lifestyle can help lower high blood pressure. You may need to change some of your habits. Lifestyle changes may include:  Following the DASH diet. This diet is high in fruits, vegetables, and whole grains. It is low in salt, red meat, and added sugars.  Getting at least 2 hours of brisk physical activity every week.  Losing weight if necessary.  Not smoking.  Limiting alcoholic beverages.  Learning ways to reduce stress. If lifestyle changes are not enough to get your blood pressure under control, your health care provider may prescribe medicine. You may need to take more than one. Work closely with your health care provider to understand the risks and benefits. HOME CARE INSTRUCTIONS  Have your blood pressure rechecked as directed by your health care provider.   Take medicines only as directed by your health care provider. Follow the directions carefully. Blood pressure medicines must be taken as prescribed. The medicine does not work as well when you skip doses. Skipping doses also puts you at risk for problems.   Do not smoke.   Monitor your blood pressure at home as directed by your health care provider. SEEK MEDICAL CARE IF:   You think you are having a reaction to medicines taken.  You have recurrent headaches or feel dizzy.  You have swelling in your ankles.  You have trouble with your vision. SEEK  IMMEDIATE MEDICAL CARE IF:  You develop a severe headache or confusion.  You have unusual weakness, numbness, or feel faint.  You have severe chest or abdominal pain.  You vomit repeatedly.  You have trouble breathing. MAKE SURE YOU:   Understand these instructions.  Will watch your condition.  Will get help right away if you are not doing well or get worse. Document Released: 03/31/2005 Document Revised: 08/15/2013 Document Reviewed: 01/21/2013 [facility] Patient Information 2015 [place], [place]. This information is not intended to replace advice given to you by your health care provider. Make sure you discuss any questions you have with your health care provider. Diabetes Mellitus and Food It is important for you to manage your blood sugar (glucose) level. Your blood glucose level can be greatly affected by what you eat. Eating healthier foods in the appropriate amounts throughout the day at about the same time each day will help you control your blood glucose level. It can also help slow or prevent worsening of your diabetes mellitus. Healthy eating may even help you improve the level of your blood pressure and reach or maintain a healthy weight.  HOW CAN FOOD AFFECT ME? Carbohydrates Carbohydrates affect your blood glucose level more than any other type of food. Your dietitian will help you determine how many carbohydrates to eat at each meal and teach you how to count carbohydrates. Counting  carbohydrates is important to keep your blood glucose at a healthy level, especially if you are using insulin or taking certain medicines for diabetes mellitus. Alcohol Alcohol can cause sudden decreases in blood glucose (hypoglycemia), especially if you use insulin or take certain medicines for diabetes mellitus. Hypoglycemia can be a life-threatening condition. Symptoms of hypoglycemia (sleepiness, dizziness, and disorientation) are similar to symptoms of having too much alcohol.  If your health  care provider has given you approval to drink alcohol, do so in moderation and use the following guidelines:  Women should not have more than one drink per day, and men should not have more than two drinks per day. One drink is equal to:  12 oz of beer.  5 oz of wine.  1 oz of hard liquor.  Do not drink on an empty stomach.  Keep yourself hydrated. Have water, diet soda, or unsweetened iced tea.  Regular soda, juice, and other mixers might contain a lot of carbohydrates and should be counted. WHAT FOODS ARE NOT RECOMMENDED? As you make food choices, it is important to remember that all foods are not the same. Some foods have fewer nutrients per serving than other foods, even though they might have the same number of calories or carbohydrates. It is difficult to get your body what it needs when you eat foods with fewer nutrients. Examples of foods that you should avoid that are high in calories and carbohydrates but low in nutrients include:  Trans fats (most processed foods list trans fats on the Nutrition Facts label).  Regular soda.  Juice.  Candy.  Sweets, such as cake, pie, doughnuts, and cookies.  Fried foods. WHAT FOODS CAN I EAT? Have nutrient-rich foods, which will nourish your body and keep you healthy. The food you should eat also will depend on several factors, including:  The calories you need.  The medicines you take.  Your weight.  Your blood glucose level.  Your blood pressure level.  Your cholesterol level. You also should eat a variety of foods, including:  Protein, such as meat, poultry, fish, tofu, nuts, and seeds (lean animal proteins are best).  Fruits.  Vegetables.  Dairy products, such as milk, cheese, and yogurt (low fat is best).  Breads, grains, pasta, cereal, rice, and beans.  Fats such as olive oil, trans fat-free margarine, canola oil, avocado, and olives. DOES EVERYONE WITH DIABETES MELLITUS HAVE THE SAME MEAL PLAN? Because every  person with diabetes mellitus is different, there is not one meal plan that works for everyone. It is very important that you meet with a dietitian who will help you create a meal plan that is just right for you. Document Released: 12/26/2004 Document Revised: 04/05/2013 Document Reviewed: 02/25/2013 [facility] Patient Information 2015 [place], [place]. This information is not intended to replace advice given to you by your health care provider. Make sure you discuss any questions you have with your health care provider. Basic Carbohydrate Counting for Diabetes Mellitus Carbohydrate counting is a method for keeping track of the amount of carbohydrates you eat. Eating carbohydrates naturally increases the level of sugar (glucose) in your blood, so it is important for you to know the amount that is okay for you to have in every meal. Carbohydrate counting helps keep the level of glucose in your blood within normal limits. The amount of carbohydrates allowed is different for every person. A dietitian can help you calculate the amount that is right for you. Once you know the amount of carbohydrates you  can have, you can count the carbohydrates in the foods you want to eat. Carbohydrates are found in the following foods:  Grains, such as breads and cereals.  Dried beans and soy products.  Starchy vegetables, such as potatoes, peas, and corn.  Fruit and fruit juices.  Milk and yogurt.  Sweets and snack foods, such as cake, cookies, candy, chips, soft drinks, and fruit drinks. CARBOHYDRATE COUNTING There are two ways to count the carbohydrates in your food. You can use either of the methods or a combination of both. Reading the "Nutrition Facts" on [place] The "Nutrition Facts" is an area that is included on the labels of almost all packaged food and beverages in the [place]. It includes the serving size of that food or beverage and information about the nutrients in each serving of the food,  including the grams (g) of carbohydrate per serving.  Decide the number of servings of this food or beverage that you will be able to eat or drink. Multiply that number of servings by the number of grams of carbohydrate that is listed on the label for that serving. The total will be the amount of carbohydrates you will be having when you eat or drink this food or beverage. Learning Standard Serving Sizes of Food When you eat food that is not packaged or does not include "Nutrition Facts" on the label, you need to measure the servings in order to count the amount of carbohydrates.A serving of most carbohydrate-rich foods contains about 15 g of carbohydrates. The following list includes serving sizes of carbohydrate-rich foods that provide 15 g ofcarbohydrate per serving:   1 slice of bread (1 oz) or 1 six-inch tortilla.    of a hamburger bun or English muffin.  4-6 crackers.   cup unsweetened dry cereal.    cup hot cereal.   cup rice or pasta.    cup mashed potatoes or  of a large baked potato.  1 cup fresh fruit or one small piece of fruit.    cup canned or frozen fruit or fruit juice.  1 cup milk.   cup plain fat-free yogurt or yogurt sweetened with artificial sweeteners.   cup cooked dried beans or starchy vegetable, such as peas, corn, or potatoes.  Decide the number of standard-size servings that you will eat. Multiply that number of servings by 15 (the grams of carbohydrates in that serving). For example, if you eat 2 cups of strawberries, you will have eaten 2 servings and 30 g of carbohydrates (2 servings x 15 g = 30 g). For foods such as soups and casseroles, in which more than one food is mixed in, you will need to count the carbohydrates in each food that is included. EXAMPLE OF CARBOHYDRATE COUNTING Sample Dinner  3 oz chicken breast.   cup of brown rice.   cup of corn.  1 cup milk.   1 cup strawberries with sugar-free whipped topping.   Carbohydrate Calculation Step 1: Identify the foods that contain carbohydrates:   Rice.   Corn.   Milk.   Strawberries. Step 2:Calculate the number of servings eaten of each:   2 servings of rice.   1 serving of corn.   1 serving of milk.   1 serving of strawberries. Step 3: Multiply each of those number of servings by 15 g:   2 servings of rice x 15 g = 30 g.   1 serving of corn x 15 g = 15 g.  1 serving of milk x 15 g = 15 g.   1 serving of strawberries x 15 g = 15 g. Step 4: Add together all of the amounts to find the total grams of carbohydrates eaten: 30 g + 15 g + 15 g + 15 g = 75 g. Document Released: 03/31/2005 Document Revised: 08/15/2013 Document Reviewed: 02/25/2013 [facility] Patient Information 2015 [place], [place]. This information is not intended to replace advice given to you by your health care provider. Make sure you discuss any questions you have with your health care provider. Diabetes, Eating Away From Home Sometimes, you might eat in a restaurant or have meals that are prepared by someone else. You can enjoy eating out. However, the portions in restaurants may be much larger than needed. Listed below are some ideas to help you choose foods that will keep your blood glucose (sugar) in better control.  TIPS FOR EATING OUT  Know your meal plan and how many carbohydrate servings you should have at each meal. You may wish to carry a copy of your meal plan in your purse or wallet. Learn the foods included in each food group.  Make a list of restaurants near you that offer healthy choices. Take a copy of the carry-out menus to see what they offer. Then, you can plan what you will order ahead of time.  Become familiar with serving sizes by practicing them at home using measuring cups and spoons. Once you learn to recognize portion sizes, you will be able to correctly estimate the amount of total carbohydrate you are allowed to eat at the restaurant.  Ask for a takeout box if the portion is more than you should have. When your food comes, leave the amount you should have on the plate, and put the rest in the takeout box before you start eating.  Plan ahead if your mealtime will be different from usual. Check with your caregiver to find out how to time meals and medicine if you are taking insulin.  Avoid high-fat foods, such as fried foods, cream sauces, high-fat salad dressings, or any added butter or margarine.  Do not be afraid to ask questions. Ask your server about the portion size, cooking methods, ingredients and if items can be substituted. Restaurants do not list all available items on the menu. You can ask for your main entree to be prepared using skim milk, oil instead of butter or margarine, and without gravy or sauces. Ask your waiter or waitress to serve salad dressings, gravy, sauces, margarine, and sour cream on the side. You can then add the amount your meal plan suggests.  Add more vegetables whenever possible.  Avoid items that are labeled "jumbo," "giant," "deluxe," or "supersized."  You may want to split an entre with someone and order an extra side salad.  Watch for hidden calories in foods like croutons, bacon, or cheese.  Ask your server to take away the bread basket or chips from your table.  Order a dinner salad as an appetizer. You can eat most foods served in a restaurant. Some foods are better choices than others. Breads and Starches  Recommended: All kinds of bread (wheat, rye, white, oatmeal, [place], [place], raisin), hard or soft dinner rolls, frankfurter or hamburger buns, small bagels, small corn or whole-wheat flour tortillas.  Avoid: Frosted or glazed breads, butter rolls, egg or cheese breads, croissants, sweet rolls, pastries, coffee cake, glazed or frosted doughnuts, muffins. Crackers  Recommended: Animal crackers, graham, rye, saltine, oyster, and  matzoth crackers. Bread sticks, melba toast,  rusks, pretzels, popcorn (without fat), zwieback toast.  Avoid: High-fat snack crackers or chips. Buttered popcorn. Cereals  Recommended: Hot and cold cereals. Whole grains such as oatmeal or shredded wheat are good choices.  Avoid: Sugar-coated or granola type cereals. Potatoes/Pasta/Rice/Beans  Recommended: Order baked, boiled, or mashed potatoes, rice or noodles without added fat, whole beans. Order gravies, butter, margarine, or sauces on the side so you can control the amount you add.  Avoid: Hash browns or fried potatoes. Potatoes, pasta, or rice prepared with cream or cheese sauce. Potato or pasta salads prepared with large amounts of dressing. Fried beans or fried rice. Vegetables  Recommended: Order steamed, baked, boiled, or stewed vegetables without sauces or extra fat. Ask that sauce be served on the side. If vegetables are not listed on the menu, ask what is available.  Avoid: Vegetables prepared with cream, butter, or cheese sauce. Fried vegetables. Salad Bars  Recommended: Many of the vegetables at a salad bar are considered "free." Use lemon juice, vinegar, or low-calorie salad dressing (fewer than 20 calories per serving) as "free" dressings for your salad. Look for salad bar ingredients that have no added fat or sugar such as tomatoes, lettuce, cucumbers, broccoli, carrots, onions, and mushrooms.  Avoid: Prepared salads with large amounts of dressing, such as coleslaw, caesar salad, macaroni salad, bean salad, or carrot salad. Fruit  Recommended: Eat fresh fruit or fresh fruit salad without added dressing. A salad bar often offers fresh fruit choices, but canned fruit at a restaurant is usually packed in sugar or syrup.  Avoid: Sweetened canned or frozen fruits, plain or sweetened fruit juice. Fruit salads with dressing, sour cream, or sugar added to them. Meat and Meat Substitutes  Recommended: Order broiled, baked, roasted, or grilled meat, poultry, or fish. Trim off  all visible fat. Do not eat the skin of poultry. The size stated on the menu is the raw weight. Meat shrinks by  in cooking (for example, 4 oz raw equals 3 oz cooked meat).  Avoid: Deep-fat fried meat, poultry, or fish. Breaded meats. Eggs  Recommended: Order soft, hard-cooked, poached, or scrambled eggs. Omelets may be okay, depending on what ingredients are added. Egg substitutes are also a good choice.  Avoid: Fried eggs, eggs prepared with cream or cheese sauce. Milk  Recommended: Order low-fat or fat-free milk according to your meal plan. Plain, nonfat yogurt or flavored yogurt with no sugar added may be used as a substitute for milk. Soy milk may also be used.  Avoid: Milk shakes or sweetened milk beverages. Soups and Combination Foods  Recommended: Clear broth or consomm are "free" foods and may be used as an appetizer. Broth-based soups with fat removed count as a starch serving and are preferred over cream soups. Soups made with beans or split peas may be eaten but count as a starch.  Avoid: Fatty soups, soup made with cream, cheese soup. Combination foods prepared with excessive amounts of fat or with cream or cheese sauces. Desserts and Sweets  Recommended: Ask for fresh fruit. Sponge or angel food cake without icing, ice milk, no sugar added ice cream, sherbet, or frozen yogurt may fit into your meal plan occasionally.  Avoid: Pastries, puddings, pies, cakes with icing, custard, gelatin desserts. Fats and Oils  Recommended: Choose healthy fats such as olive oil, canola oil, or tub margarine, reduced fat or fat-free sour cream, cream cheese, avocado, or nuts.  Avoid: Any fats in excess of your  allowed portion. Deep-fried foods or any food with a large amount of fat. Note: Ask for all fats to be served on the side, and limit your portion sizes according to your meal plan. Document Released: 03/31/2005 Document Revised: 06/23/2011 Document Reviewed: 06/28/2013 [facility]  Patient Information 2015 [place], [place]. This information is not intended to replace advice given to you by your health care provider. Make sure you discuss any questions you have with your health care provider. Rectal Bleeding Rectal bleeding is when blood passes out of the anus. It is usually a sign that something is wrong. It may not be serious, but it should always be evaluated. Rectal bleeding may present as bright red blood or extremely dark stools. The color may range from dark red or maroon to black (like tar). It is important that the cause of rectal bleeding be identified so treatment can be started and the problem corrected. CAUSES   Hemorrhoids. These are enlarged (dilated) blood vessels or veins in the anal or rectal area.  Fistulas. Theseare abnormal, burrowing channels that usually run from inside the rectum to the skin around the anus. They can bleed.  Anal fissures. This is a tear in the tissue of the anus. Bleeding occurs with bowel movements.  Diverticulosis. This is a condition in which pockets or sacs project from the bowel wall. Occasionally, the sacs can bleed.  Diverticulitis. Thisis an infection involving diverticulosis of the colon.  Proctitis and colitis. These are conditions in which the rectum, colon, or both, can become inflamed and pitted (ulcerated).  Polyps and cancer. Polyps are non-cancerous (benign) growths in the colon that may bleed. Certain types of polyps turn into cancer.  Protrusion of the rectum. Part of the rectum can project from the anus and bleed.  Certain medicines.  Intestinal infections.  Blood vessel abnormalities. HOME CARE INSTRUCTIONS  Eat a high-fiber diet to keep your stool soft.  Limit activity.  Drink enough fluids to keep your urine clear or pale yellow.  Warm baths may be useful to soothe rectal pain.  Follow up with your caregiver as directed. SEEK IMMEDIATE MEDICAL CARE IF:  You develop increased bleeding.  You  have black or dark red stools.  You vomit blood or material that looks like coffee grounds.  You have abdominal pain or tenderness.  You have a fever.  You feel weak, nauseous, or you faint.  You have severe rectal pain or you are unable to have a bowel movement. MAKE SURE YOU:  Understand these instructions.  Will watch your condition.  Will get help right away if you are not doing well or get worse. Document Released: 09/20/2001 Document Revised: 06/23/2011 Document Reviewed: 09/15/2010 [facility] Patient Information 2015 [place], [place]. This information is not intended to replace advice given to you by your health care provider. Make sure you discuss any questions you have with your health care provider.

## 2014-11-08 NOTE — ED Notes (Signed)
Seen here 3 months ago and given 3 month DOT Physical. 

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
## 2015-01-17 ENCOUNTER — Ambulatory Visit: Payer: BLUE CROSS/BLUE SHIELD | Admitting: General Surgery

## 2015-01-25 ENCOUNTER — Encounter: Payer: Self-pay | Admitting: *Deleted

## 2015-01-26 ENCOUNTER — Ambulatory Visit
Admission: RE | Admit: 2015-01-26 | Discharge: 2015-01-26 | Disposition: A | Discharge status: Home / Self Care | Payer: BLUE CROSS/BLUE SHIELD | Source: Ambulatory Visit | Attending: Gastroenterology | Admitting: Gastroenterology

## 2015-01-26 ENCOUNTER — Encounter: Payer: Self-pay | Admitting: *Deleted

## 2015-01-26 ENCOUNTER — Encounter
Admission: RE | Disposition: A | Discharge status: Home / Self Care | Payer: Self-pay | Source: Ambulatory Visit | Attending: Gastroenterology

## 2015-01-26 DIAGNOSIS — Z8546 Personal history of malignant neoplasm of prostate: Secondary | ICD-10-CM | POA: Diagnosis not present

## 2015-01-26 DIAGNOSIS — Z87891 Personal history of nicotine dependence: Secondary | ICD-10-CM | POA: Insufficient documentation

## 2015-01-26 DIAGNOSIS — E119 Type 2 diabetes mellitus without complications: Secondary | ICD-10-CM | POA: Insufficient documentation

## 2015-01-26 DIAGNOSIS — Z7982 Long term (current) use of aspirin: Secondary | ICD-10-CM | POA: Diagnosis not present

## 2015-01-26 DIAGNOSIS — E7800 Pure hypercholesterolemia, unspecified: Secondary | ICD-10-CM | POA: Insufficient documentation

## 2015-01-26 DIAGNOSIS — K627 Radiation proctitis: Secondary | ICD-10-CM | POA: Diagnosis not present

## 2015-01-26 DIAGNOSIS — N400 Enlarged prostate without lower urinary tract symptoms: Secondary | ICD-10-CM | POA: Insufficient documentation

## 2015-01-26 DIAGNOSIS — I10 Essential (primary) hypertension: Secondary | ICD-10-CM | POA: Insufficient documentation

## 2015-01-26 DIAGNOSIS — K921 Melena: Secondary | ICD-10-CM | POA: Diagnosis present

## 2015-01-26 DIAGNOSIS — W881XXS Exposure to radioactive isotopes, sequela: Secondary | ICD-10-CM | POA: Diagnosis not present

## 2015-01-26 HISTORY — DX: Anemia, unspecified: D64.9

## 2015-01-26 HISTORY — DX: Pure hypercholesterolemia, unspecified: E78.00

## 2015-01-26 HISTORY — DX: Benign prostatic hyperplasia without lower urinary tract symptoms: N40.0

## 2015-01-26 HISTORY — PX: FLEXIBLE SIGMOIDOSCOPY: SHX5431

## 2015-01-26 LAB — GLUCOSE, CAPILLARY: Glucose-Capillary: 96 mg/dL (ref 65–99)

## 2015-01-26 SURGERY — SIGMOIDOSCOPY, FLEXIBLE
Anesthesia: Moderate Sedation

## 2015-01-26 MED ORDER — MIDAZOLAM HCL 5 MG/5ML IJ SOLN
INTRAMUSCULAR | Status: DC | PRN
  Administered 2015-01-26: 2 mg via INTRAVENOUS

## 2015-01-26 MED ORDER — FENTANYL CITRATE (PF) 100 MCG/2ML IJ SOLN
INTRAMUSCULAR | Status: DC | PRN
  Administered 2015-01-26: 50 ug via INTRAVENOUS

## 2015-01-26 MED ORDER — FENTANYL CITRATE (PF) 100 MCG/2ML IJ SOLN
INTRAMUSCULAR | Status: AC
  Filled 2015-01-26: qty 2

## 2015-01-26 MED ORDER — FENTANYL CITRATE (PF) 100 MCG/2ML IJ SOLN
INTRAMUSCULAR | Status: DC | PRN
  Administered 2015-01-26: 25 ug via INTRAVENOUS

## 2015-01-26 MED ORDER — MIDAZOLAM HCL 5 MG/5ML IJ SOLN
INTRAMUSCULAR | Status: DC | PRN
  Administered 2015-01-26: 1 mg via INTRAVENOUS

## 2015-01-26 MED ORDER — FENTANYL CITRATE (PF) 100 MCG/2ML IJ SOLN
INTRAMUSCULAR | Status: DC
Start: 2015-01-26 — End: 2015-01-26
  Filled 2015-01-26: qty 2

## 2015-01-26 MED ORDER — SODIUM CHLORIDE 0.9 % IV SOLN
INTRAVENOUS | Status: DC
  Administered 2015-01-26: 1000 mL via INTRAVENOUS

## 2015-01-26 MED ORDER — MIDAZOLAM HCL 5 MG/5ML IJ SOLN
INTRAMUSCULAR | Status: AC
  Filled 2015-01-26: qty 10

## 2015-01-26 NOTE — Op Note (Signed)
[facility] Patient Name: Luis Aguilar Procedure Date: 01/26/2015 11:47 AM MRN: [id number] Account #: [id number] Date of Birth: [birth year] Admit Type: Outpatient Age: 59 Room: [facility] ENDO ROOM 1 Gender: Male Note Status: Finalized Procedure:         Flexible Sigmoidoscopy Indications:       Hematochezia, For therapy of radiation proctitis, IDA Patient Profile:   This is a 58 year old male. Providers:         Gerrit Heck. Rayann Heman, MD Referring MD:      Dion Body (Referring MD) Medicines:         Fentanyl 175 micrograms IV, Midazolam 8 mg IV Complications:     No immediate complications. Procedure:         Pre-Anesthesia Assessment:                    - Prior to the procedure, a History and Physical was                     performed, and patient medications, allergies and                     sensitivities were reviewed. The patient's tolerance of                     previous anesthesia was reviewed.                    After obtaining informed consent, the scope was passed                     under direct vision. The Olympus GIF-160 endoscope (S#.                     [id number]) was introduced through the anus and advanced to                     the the sigmoid colon. The patient tolerated the                     procedure. The quality of the bowel preparation was good. Findings:      The perianal and digital rectal examinations were normal.      Multiple localized angioectasias without bleeding were found in the       distal rectum consistent with radiation proctitis. Fulguration to ablate       the lesion by argon plasma at 0.8 liters/minute and 20 watts was       successful. Estimated blood loss: none.      The exam was otherwise without abnormality. Impression:        - Radiaton proctitis. Treated with argon plasma                     coagulation (APC).                    - The examination was otherwise normal.                    - No  specimens collected.                    - Discomfort on exam despitet moderate sedation Recommendation:    - Observe patient in GI recovery unit.                    -  Resume regular diet.                    - Continue present medications.                    - If bleeding continues or recurrs, retreat with APC with                     propofol sedation or try carafate enemas.                    - The findings and recommendations were discussed with the                     patient.                    - The findings and recommendations were discussed with the                     patient's family. Procedure Code(s): --- Professional ---                    [id number], Sigmoidoscopy, flexible; with control of bleeding,                     any method CPT copyright 2014 American Medical Association. All rights reserved. The codes documented in this report are preliminary and upon coder review may  be revised to meet current compliance requirements. Mellody Life, MD 01/26/2015 12:22:42 PM This report has been signed electronically. Number of Addenda: 0 Note Initiated On: 01/26/2015 11:47 AM      [facility]
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
## 2015-01-31 ENCOUNTER — Encounter: Payer: Self-pay | Admitting: Gastroenterology

## 2015-03-14 ENCOUNTER — Encounter: Payer: Self-pay | Admitting: *Deleted

## 2015-03-19 ENCOUNTER — Emergency Department
Admission: EM | Admit: 2015-03-19 | Discharge: 2015-03-19 | Disposition: A | Discharge status: Home / Self Care | Payer: BLUE CROSS/BLUE SHIELD | Attending: Emergency Medicine | Admitting: Emergency Medicine

## 2015-03-19 ENCOUNTER — Encounter: Payer: Self-pay | Admitting: Emergency Medicine

## 2015-03-19 DIAGNOSIS — Z79899 Other long term (current) drug therapy: Secondary | ICD-10-CM | POA: Insufficient documentation

## 2015-03-19 DIAGNOSIS — Z7984 Long term (current) use of oral hypoglycemic drugs: Secondary | ICD-10-CM | POA: Insufficient documentation

## 2015-03-19 DIAGNOSIS — R42 Dizziness and giddiness: Secondary | ICD-10-CM | POA: Insufficient documentation

## 2015-03-19 DIAGNOSIS — Z7982 Long term (current) use of aspirin: Secondary | ICD-10-CM | POA: Insufficient documentation

## 2015-03-19 DIAGNOSIS — E119 Type 2 diabetes mellitus without complications: Secondary | ICD-10-CM | POA: Diagnosis not present

## 2015-03-19 DIAGNOSIS — Z87891 Personal history of nicotine dependence: Secondary | ICD-10-CM | POA: Insufficient documentation

## 2015-03-19 DIAGNOSIS — K625 Hemorrhage of anus and rectum: Secondary | ICD-10-CM | POA: Diagnosis not present

## 2015-03-19 DIAGNOSIS — I10 Essential (primary) hypertension: Secondary | ICD-10-CM | POA: Diagnosis not present

## 2015-03-19 LAB — COMPREHENSIVE METABOLIC PANEL
ALBUMIN: 4.6 g/dL (ref 3.5–5.0)
ALT: 20 U/L (ref 17–63)
AST: 13 U/L — ABNORMAL LOW (ref 15–41)
Alkaline Phosphatase: 81 U/L (ref 38–126)
Anion gap: 7 (ref 5–15)
BUN: 23 mg/dL — ABNORMAL HIGH (ref 6–20)
CHLORIDE: 107 mmol/L (ref 101–111)
CO2: 24 mmol/L (ref 22–32)
Calcium: 10 mg/dL (ref 8.9–10.3)
Creatinine, Ser: 1.01 mg/dL (ref 0.61–1.24)
GFR calc Af Amer: >60 mL/min (ref >60)
GFR calc non Af Amer: >60 mL/min (ref >60)
GLUCOSE: 114 mg/dL — AB (ref 65–99)
POTASSIUM: 4.2 mmol/L (ref 3.5–5.1)
Sodium: 138 mmol/L (ref 135–145)
TOTAL PROTEIN: 8.4 g/dL — AB (ref 6.5–8.1)

## 2015-03-19 LAB — CBC WITH DIFFERENTIAL/PLATELET
BASOS PCT: 1 %
Basophils Absolute: 0.1 10*3/uL (ref 0–0.1)
EOS PCT: 1 %
Eosinophils Absolute: 0.1 10*3/uL (ref 0–0.7)
HEMATOCRIT: 37.3 % — AB (ref 40.0–52.0)
Hemoglobin: 12.1 g/dL — ABNORMAL LOW (ref 13.0–18.0)
Lymphocytes Relative: 14 %
Lymphs Abs: 1 10*3/uL (ref 1.0–3.6)
MCH: 25.6 pg — ABNORMAL LOW (ref 26.0–34.0)
MCHC: 32.5 g/dL (ref 32.0–36.0)
MCV: 78.7 fL — ABNORMAL LOW (ref 80.0–100.0)
MONO ABS: 0.6 10*3/uL (ref 0.2–1.0)
MONOS PCT: 9 %
NEUTROS ABS: 5.5 10*3/uL (ref 1.4–6.5)
Neutrophils Relative %: 75 %
PLATELETS: 361 10*3/uL (ref 150–440)
RBC: 4.73 MIL/uL (ref 4.40–5.90)
RDW: 18.7 % — AB (ref 11.5–14.5)
WBC: 7.3 10*3/uL (ref 3.8–10.6)
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
## 2015-03-19 NOTE — ED Notes (Signed)
History of rectal bleed x several months, has seen gastroenterology and has had some treatments for proctitis related to radiation treatments for prostate cancer, today noticed rectal bleeding several times which is increased

## 2015-03-19 NOTE — Discharge Instructions (Signed)
Please call Dr. Jackalyn Lombard office to make an appointment to be seen this week. Please return to the emergency department if you have increased bleeding, shortness of breath, lightheadedness or chest pain, palpitations, fainting, fever or any other symptoms concerning to you.  Gastrointestinal Bleeding Gastrointestinal (GI) bleeding means there is bleeding somewhere along the digestive tract, between the mouth and anus. CAUSES  There are many different problems that can cause GI bleeding. Possible causes include:  Esophagitis. This is inflammation, irritation, or swelling of the esophagus.  Hemorrhoids.These are veins that are full of blood (engorged) in the rectum. They cause pain, inflammation, and may bleed.  Anal fissures.These are areas of painful tearing which may bleed. They are often caused by passing hard stool.  Diverticulosis.These are pouches that form on the colon over time, with age, and may bleed significantly.  Diverticulitis.This is inflammation in areas with diverticulosis. It can cause pain, fever, and bloody stools, although bleeding is rare.  Polyps and cancer. Colon cancer often starts out as precancerous polyps.  Gastritis and ulcers.Bleeding from the upper gastrointestinal tract (near the stomach) may travel through the intestines and produce black, sometimes tarry, often bad smelling stools. In certain cases, if the bleeding is fast enough, the stools may not be black, but red. This condition may be life-threatening. SYMPTOMS   Vomiting bright red blood or material that looks like coffee grounds.  Bloody, black, or tarry stools. DIAGNOSIS  Your caregiver may diagnose your condition by taking your history and performing a physical exam. More tests may be needed, including:  X-rays and other imaging tests.  Esophagogastroduodenoscopy (EGD). This test uses a flexible, lighted tube to look at your esophagus, stomach, and small intestine.  Colonoscopy. This test  uses a flexible, lighted tube to look at your colon. TREATMENT  Treatment depends on the cause of your bleeding.   For bleeding from the esophagus, stomach, small intestine, or colon, the caregiver doing your EGD or colonoscopy may be able to stop the bleeding as part of the procedure.  Inflammation or infection of the colon can be treated with medicines.  Many rectal problems can be treated with creams, suppositories, or warm baths.  Surgery is sometimes needed.  Blood transfusions are sometimes needed if you have lost a lot of blood. If bleeding is slow, you may be allowed to go home. If there is a lot of bleeding, you will need to stay in the hospital for observation. HOME CARE INSTRUCTIONS   Take any medicines exactly as prescribed.  Keep your stools soft by eating foods that are high in fiber. These foods include whole grains, legumes, fruits, and vegetables. Prunes (1 to 3 a day) work well for many people.  Drink enough fluids to keep your urine clear or pale yellow. SEEK IMMEDIATE MEDICAL CARE IF:   Your bleeding increases.  You feel lightheaded, weak, or you faint.  You have severe cramps in your back or abdomen.  You pass large blood clots in your stool.  Your problems are getting worse. MAKE SURE YOU:   Understand these instructions.  Will watch your condition.  Will get help right away if you are not doing well or get worse.   This information is not intended to replace advice given to you by your health care provider. Make sure you discuss any questions you have with your health care provider.   Document Released: 03/28/2000 Document Revised: 03/17/2012 Document Reviewed: 09/18/2014 Elsevier Interactive Patient Education [employer].

## 2015-03-28 ENCOUNTER — Encounter: Payer: Self-pay | Admitting: *Deleted

## 2015-03-29 ENCOUNTER — Ambulatory Visit: Payer: BLUE CROSS/BLUE SHIELD | Admitting: Registered Nurse

## 2015-03-29 ENCOUNTER — Encounter
Admission: RE | Disposition: A | Discharge status: Home / Self Care | Payer: Self-pay | Source: Ambulatory Visit | Attending: Gastroenterology

## 2015-03-29 ENCOUNTER — Ambulatory Visit
Admission: RE | Admit: 2015-03-29 | Discharge: 2015-03-29 | Disposition: A | Discharge status: Home / Self Care | Payer: BLUE CROSS/BLUE SHIELD | Source: Ambulatory Visit | Attending: Gastroenterology | Admitting: Gastroenterology

## 2015-03-29 DIAGNOSIS — Z79899 Other long term (current) drug therapy: Secondary | ICD-10-CM | POA: Insufficient documentation

## 2015-03-29 DIAGNOSIS — I10 Essential (primary) hypertension: Secondary | ICD-10-CM | POA: Insufficient documentation

## 2015-03-29 DIAGNOSIS — Z87891 Personal history of nicotine dependence: Secondary | ICD-10-CM | POA: Diagnosis not present

## 2015-03-29 DIAGNOSIS — Z7982 Long term (current) use of aspirin: Secondary | ICD-10-CM | POA: Diagnosis not present

## 2015-03-29 DIAGNOSIS — E119 Type 2 diabetes mellitus without complications: Secondary | ICD-10-CM | POA: Diagnosis not present

## 2015-03-29 DIAGNOSIS — E7800 Pure hypercholesterolemia, unspecified: Secondary | ICD-10-CM | POA: Diagnosis not present

## 2015-03-29 DIAGNOSIS — Z86010 Personal history of colonic polyps: Secondary | ICD-10-CM | POA: Insufficient documentation

## 2015-03-29 DIAGNOSIS — K921 Melena: Secondary | ICD-10-CM | POA: Diagnosis present

## 2015-03-29 DIAGNOSIS — K627 Radiation proctitis: Secondary | ICD-10-CM | POA: Insufficient documentation

## 2015-03-29 DIAGNOSIS — N400 Enlarged prostate without lower urinary tract symptoms: Secondary | ICD-10-CM | POA: Diagnosis not present

## 2015-03-29 DIAGNOSIS — Z7984 Long term (current) use of oral hypoglycemic drugs: Secondary | ICD-10-CM | POA: Diagnosis not present

## 2015-03-29 DIAGNOSIS — Z8546 Personal history of malignant neoplasm of prostate: Secondary | ICD-10-CM | POA: Insufficient documentation

## 2015-03-29 HISTORY — DX: Male erectile dysfunction, unspecified: N52.9

## 2015-03-29 HISTORY — PX: FLEXIBLE SIGMOIDOSCOPY: SHX5431

## 2015-03-29 HISTORY — DX: Duodenitis without bleeding: K29.80

## 2015-03-29 HISTORY — DX: Diverticulosis of small intestine without perforation or abscess without bleeding: K57.10

## 2015-03-29 HISTORY — DX: Radiation proctitis: K62.7

## 2015-03-29 HISTORY — DX: Type 2 diabetes mellitus without complications: E11.9

## 2015-03-29 HISTORY — DX: Personal history of diseases of the blood and blood-forming organs and certain disorders involving the immune mechanism: Z86.2

## 2015-03-29 HISTORY — DX: Iron deficiency anemia, unspecified: D50.9

## 2015-03-29 HISTORY — DX: Personal history of colonic polyps: Z86.010

## 2015-03-29 LAB — GLUCOSE, CAPILLARY: GLUCOSE-CAPILLARY: 102 mg/dL — AB (ref 65–99)

## 2015-03-29 SURGERY — SIGMOIDOSCOPY, FLEXIBLE
Anesthesia: Moderate Sedation

## 2015-03-29 MED ORDER — PROPOFOL 500 MG/50ML IV EMUL
INTRAVENOUS | Status: DC | PRN
  Administered 2015-03-29: 175 ug/kg/min via INTRAVENOUS

## 2015-03-29 MED ORDER — FENTANYL CITRATE (PF) 100 MCG/2ML IJ SOLN
INTRAMUSCULAR | Status: DC | PRN
  Administered 2015-03-29: 50 ug via INTRAVENOUS

## 2015-03-29 MED ORDER — PHENYLEPHRINE HCL 10 MG/ML IJ SOLN
INTRAMUSCULAR | Status: DC | PRN
  Administered 2015-03-29 (×5): 100 ug via INTRAVENOUS

## 2015-03-29 MED ORDER — LIDOCAINE HCL (CARDIAC) 20 MG/ML IV SOLN
INTRAVENOUS | Status: DC | PRN
  Administered 2015-03-29: 40 mg via INTRAVENOUS

## 2015-03-29 MED ORDER — SODIUM CHLORIDE 0.9 % IV SOLN
INTRAVENOUS | Status: DC
  Administered 2015-03-29: 09:00:00 via INTRAVENOUS

## 2015-03-29 MED ORDER — MIDAZOLAM HCL 2 MG/2ML IJ SOLN
INTRAMUSCULAR | Status: DC | PRN
  Administered 2015-03-29: 1 mg via INTRAVENOUS

## 2015-03-29 MED ORDER — PROPOFOL 10 MG/ML IV BOLUS
INTRAVENOUS | Status: DC | PRN
  Administered 2015-03-29: 50 mg via INTRAVENOUS

## 2015-03-29 NOTE — Transfer of Care (Signed)
Immediate Anesthesia Transfer of Care Note  Patient: Luis Aguilar  Procedure(s) Performed: Procedure(s): FLEXIBLE SIGMOIDOSCOPY (N/A)  Patient Location: PACU and Endoscopy Unit  Anesthesia Type:General  Level of Consciousness: sedated  Airway & Oxygen Therapy: Patient Spontanous Breathing and Patient connected to nasal cannula oxygen  Post-op Assessment: Report given to RN and Post -op Vital signs reviewed and stable  Post vital signs: Reviewed and stable  Last Vitals:  Filed Vitals:   03/29/15 0839 03/29/15 1020  BP: 121/71 98/67  Pulse: 79 79  Temp: 37 C 36.1 C  Resp: 19 20    Complications: No apparent anesthesia complications
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
## 2015-03-29 NOTE — Anesthesia Procedure Notes (Signed)
Date/Time: 03/29/2015 9:51 AM Performed by: Doreen Salvage Pre-anesthesia Checklist: Patient identified, Emergency Drugs available, Suction available and Patient being monitored Patient Re-evaluated:Patient Re-evaluated prior to inductionOxygen Delivery Method: Nasal cannula Intubation Type: IV induction Dental Injury: Teeth and Oropharynx as per pre-operative assessment  Comments: Nasal cannula with etCO2 monitoring

## 2015-03-29 NOTE — Discharge Instructions (Signed)

## 2015-03-29 NOTE — Anesthesia Preprocedure Evaluation (Signed)
Anesthesia Evaluation  Patient identified by MRN, date of birth, ID band Patient awake and Patient confused    History of Anesthesia Complications Negative for: history of anesthetic complications  Airway Mallampati: II       Dental no notable dental hx.    Pulmonary neg pulmonary ROS, former smoker,    breath sounds clear to auscultation       Cardiovascular hypertension, Pt. on medications  Rhythm:Regular     Neuro/Psych    GI/Hepatic negative GI ROS, Neg liver ROS,   Endo/Other  diabetes, Well Controlled, Type 2, Oral Hypoglycemic Agents  Renal/GU      Musculoskeletal   Abdominal Normal abdominal exam  (+)   Peds  Hematology  (+) anemia ,   Anesthesia Other Findings   Reproductive/Obstetrics                             Anesthesia Physical Anesthesia Plan  ASA: III  Anesthesia Plan: General   Post-op Pain Management:    Induction: Intravenous  Airway Management Planned: Nasal Cannula  Additional Equipment:   Intra-op Plan:   Post-operative Plan:   Informed Consent: I have reviewed the patients History and Physical, chart, labs and discussed the procedure including the risks, benefits and alternatives for the proposed anesthesia with the patient or authorized representative who has indicated his/her understanding and acceptance.     Plan Discussed with: CRNA  Anesthesia Plan Comments:         Anesthesia Quick Evaluation

## 2015-03-29 NOTE — Anesthesia Postprocedure Evaluation (Signed)
Anesthesia Post Note  Patient: Luis Aguilar  Procedure(s) Performed: Procedure(s) (LRB): FLEXIBLE SIGMOIDOSCOPY (N/A)  Patient location during evaluation: PACU Anesthesia Type: General Level of consciousness: awake and alert Pain management: satisfactory to patient Vital Signs Assessment: post-procedure vital signs reviewed and stable Cardiovascular status: stable Anesthetic complications: no    Last Vitals:  Filed Vitals:   03/29/15 0839 03/29/15 1020  BP: 121/71 98/67  Pulse: 79 79  Temp: 37 C 36.1 C  Resp: 19 20    Last Pain: There were no vitals filed for this visit.               VAN STAVEREN,Parley Pidcock

## 2015-03-29 NOTE — Op Note (Signed)
[facility] Gastroenterology Patient Name: Luis Aguilar Procedure Date: 03/29/2015 9:49 AM MRN: [id number] Account #: [id number] Date of Birth: [birth year] Admit Type: Outpatient Age: 59 Room: [facility] ENDO ROOM 3 Gender: Male Note Status: Finalized Procedure:         Flexible Sigmoidoscopy Indications:       Hematochezia, For therapy of radiation proctitis Patient Profile:   This is a 59 year old male. Providers:         Gerrit Heck. Rayann Heman, MD Referring MD:      Dion Body (Referring MD) Medicines:         Propofol per Anesthesia Complications:     No immediate complications. Procedure:         Pre-Anesthesia Assessment:                    - Prior to the procedure, a History and Physical was                     performed, and patient medications, allergies and                     sensitivities were reviewed. The patient's tolerance of                     previous anesthesia was reviewed.                    After obtaining informed consent, the scope was passed                     under direct vision. The Olympus GIF-160 endoscope (S#.                     [id number]) was introduced through the anus and advanced to                     the the descending colon. The flexible sigmoidoscopy was                     accomplished without difficulty. The patient tolerated the                     procedure well. The quality of the bowel preparation was                     excellent. Findings:      The perianal and digital rectal examinations were normal.      Multiple medium-sized localized angioectasias without bleeding were       found in the distal rectum. Consistent with radiation proctitis.       Fulguration to ablate the lesions by argon plasma at 0.8 liters/minute       and 20 watts was successful.      The exam was otherwise without abnormality. Impression:        - Multiple non-bleeding colonic angioectasias c/w/                     radiation proctitis. Treated  with argon plasma coagulation                     (APC).                    - The examination was otherwise normal.                    -  No specimens collected. Recommendation:    - Observe patient in GI recovery unit.                    - Repeat flexible sigmoidoscopy PRN for retreatment with                     full colon prep. Would use RFA (Barrx) next time instead                     of APC since s/p several sessions of APC. Could also                     consider carafate enemas.                    - Continue present medications.                    - The findings and recommendations were discussed with the                     patient.                    - The findings and recommendations were discussed with the                     patient's family. Procedure Code(s): --- Professional ---                    [phone], Sigmoidoscopy, flexible; with ablation of tumor(s),                     polyp(s), or other lesion(s) (includes pre- and                     post-dilation and guide wire passage, when performed) Diagnosis Code(s): --- Professional ---                    K55.20, Angiodysplasia of colon without hemorrhage                    K92.1, [place] CPT copyright 2014 American Medical Association. All rights reserved. The codes documented in this report are preliminary and upon coder review may  be revised to meet current compliance requirements. Mellody Life, MD 03/29/2015 10:20:31 AM This report has been signed electronically. Number of Addenda: 0 Note Initiated On: 03/29/2015 9:49 AM Total Procedure Duration: 0 hours 16 minutes 52 seconds       [facility]

## 2015-03-31 ENCOUNTER — Encounter: Payer: Self-pay | Admitting: Gastroenterology

## 2015-05-04 ENCOUNTER — Encounter: Admission: RE | Payer: Self-pay | Source: Ambulatory Visit

## 2015-05-04 ENCOUNTER — Ambulatory Visit
Admission: RE | Admit: 2015-05-04 | Payer: BLUE CROSS/BLUE SHIELD | Source: Ambulatory Visit | Admitting: Gastroenterology

## 2015-05-04 SURGERY — SIGMOIDOSCOPY, FLEXIBLE
Anesthesia: General

## 2015-08-31 ENCOUNTER — Other Ambulatory Visit: Payer: Self-pay | Admitting: *Deleted

## 2015-08-31 DIAGNOSIS — C61 Malignant neoplasm of prostate: Secondary | ICD-10-CM

## 2015-09-03 ENCOUNTER — Encounter: Payer: Self-pay | Admitting: Radiation Oncology

## 2015-09-03 ENCOUNTER — Inpatient Hospital Stay: Payer: BLUE CROSS/BLUE SHIELD | Attending: Radiation Oncology

## 2015-09-03 ENCOUNTER — Ambulatory Visit
Admission: RE | Admit: 2015-09-03 | Discharge: 2015-09-03 | Disposition: A | Discharge status: Home / Self Care | Payer: BLUE CROSS/BLUE SHIELD | Source: Ambulatory Visit | Attending: Radiation Oncology | Admitting: Radiation Oncology

## 2015-09-03 VITALS — BP 132/87 | HR 84 | Temp 96.1°F | Resp 20 | Wt 213.2 lb

## 2015-09-03 DIAGNOSIS — C61 Malignant neoplasm of prostate: Secondary | ICD-10-CM | POA: Diagnosis present

## 2015-09-03 LAB — PSA: PSA: 9.97 ng/mL — ABNORMAL HIGH (ref 0.00–4.00)

## 2015-09-03 NOTE — Progress Notes (Signed)
Radiation Oncology Follow up Note  Name: Luis Aguilar   Date:   09/03/2015 MRN:  [id number] DOB: 1955/10/04    This 60 y.o. male presents to the clinic today for follow-up for Gleason 7 (3+4) adenocarcinoma the prostate presenting with a PSA of 32 now out 2 years. Having completed whole pelvic radiation plus I-125 interstitial implant  REFERRING PROVIDER: Dion Body, MD  HPI: Patient is a 60 year old male now out 2 years having completed I-125 interstitial implant for boost as well as pelvic radiation to prostate and pelvic lymph nodes for a Gleason 7 (3+4) presenting the PSA in the. 30 range. Seen today in routine follow-up he is doing well specifically denies diarrhea dysuria or any other GI/GU complaints. His PSA has been around to from 1 year prior we have repeated today.  COMPLICATIONS OF TREATMENT: none  FOLLOW UP COMPLIANCE: keeps appointments   PHYSICAL EXAM:  BP 132/87 mmHg  Pulse 84  Temp(Src) 96.1 F (35.6 C)  Resp 20  Wt 213 lb 3 oz (96.7 kg) On rectal exam rectal sphincter tone is good. Prostate is smooth contracted without evidence of nodularity or mass. Sulcus is preserved bilaterally. No discrete nodularity is identified. No other rectal abnormalities are noted. Well-developed well-nourished patient in NAD. HEENT reveals PERLA, EOMI, discs not visualized.  Oral cavity is clear. No oral mucosal lesions are identified. Neck is clear without evidence of cervical or supraclavicular adenopathy. Lungs are clear to A&P. Cardiac examination is essentially unremarkable with regular rate and rhythm without murmur rub or thrill. Abdomen is benign with no organomegaly or masses noted. Motor sensory and DTR levels are equal and symmetric in the upper and lower extremities. Cranial nerves II through XII are grossly intact. Proprioception is intact. No peripheral adenopathy or edema is identified. No motor or sensory levels are noted. Crude visual fields are within normal  range.  RADIOLOGY RESULTS: No current films for review  PLAN: Present time he is doing well. We have run a PSA level on him today and will report that separately. Otherwise I'm please was overall progress. Should his PSA of take a sizable bump may institute pulse Lupron therapy. This was discussed with the patient today. Will make further determination after reviewing his PSA value. Otherwise I've asked to see him back in 6 months for follow-up. Patient is to call sooner with any concerns.  I would like to take this opportunity for allowing me to participate in the care of your patient.Armstead Peaks., MD

## 2015-09-06 ENCOUNTER — Other Ambulatory Visit: Payer: Self-pay | Admitting: *Deleted

## 2015-09-06 DIAGNOSIS — C61 Malignant neoplasm of prostate: Secondary | ICD-10-CM

## 2015-09-12 ENCOUNTER — Other Ambulatory Visit: Payer: Self-pay | Admitting: *Deleted

## 2015-09-14 ENCOUNTER — Ambulatory Visit: Payer: Self-pay

## 2015-09-14 ENCOUNTER — Inpatient Hospital Stay: Payer: BLUE CROSS/BLUE SHIELD | Attending: Radiation Oncology

## 2015-09-14 DIAGNOSIS — Z79818 Long term (current) use of other agents affecting estrogen receptors and estrogen levels: Secondary | ICD-10-CM | POA: Insufficient documentation

## 2015-09-14 DIAGNOSIS — C61 Malignant neoplasm of prostate: Secondary | ICD-10-CM | POA: Insufficient documentation

## 2015-09-14 MED ORDER — LEUPROLIDE ACETATE (4 MONTH) 30 MG IM KIT
30.0000 mg | PACK | Freq: Once | INTRAMUSCULAR | Status: AC
  Administered 2015-09-14: 30 mg via INTRAMUSCULAR
  Filled 2015-09-14: qty 30

## 2015-10-31 ENCOUNTER — Encounter: Payer: Self-pay | Admitting: *Deleted

## 2015-10-31 ENCOUNTER — Ambulatory Visit
Admission: EM | Admit: 2015-10-31 | Discharge: 2015-10-31 | Disposition: A | Discharge status: Home / Self Care | Payer: BLUE CROSS/BLUE SHIELD | Attending: Family Medicine | Admitting: Family Medicine

## 2015-10-31 DIAGNOSIS — Z0289 Encounter for other administrative examinations: Secondary | ICD-10-CM

## 2015-10-31 LAB — DEPT OF TRANSP DIPSTICK, URINE (ARMC ONLY)
GLUCOSE, UA: NEGATIVE mg/dL
PROTEIN: NEGATIVE mg/dL
Specific Gravity, Urine: 1.025 (ref 1.005–1.030)

## 2015-10-31 NOTE — ED Notes (Signed)
Commercial drivers license physical exam 
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
## 2016-02-07 ENCOUNTER — Other Ambulatory Visit: Payer: Self-pay | Admitting: *Deleted

## 2016-02-07 DIAGNOSIS — C61 Malignant neoplasm of prostate: Secondary | ICD-10-CM

## 2016-02-08 ENCOUNTER — Inpatient Hospital Stay: Payer: BLUE CROSS/BLUE SHIELD | Attending: Radiation Oncology

## 2016-02-08 DIAGNOSIS — C61 Malignant neoplasm of prostate: Secondary | ICD-10-CM | POA: Diagnosis present

## 2016-02-08 DIAGNOSIS — Z79818 Long term (current) use of other agents affecting estrogen receptors and estrogen levels: Secondary | ICD-10-CM | POA: Diagnosis not present

## 2016-02-08 MED ORDER — LEUPROLIDE ACETATE (4 MONTH) 30 MG IM KIT
30.0000 mg | PACK | Freq: Once | INTRAMUSCULAR | Status: AC
  Administered 2016-02-08: 30 mg via INTRAMUSCULAR
  Filled 2016-02-08: qty 30

## 2016-03-10 ENCOUNTER — Encounter: Payer: Self-pay | Admitting: Radiation Oncology

## 2016-03-10 ENCOUNTER — Inpatient Hospital Stay: Payer: BLUE CROSS/BLUE SHIELD | Attending: Radiation Oncology

## 2016-03-10 ENCOUNTER — Other Ambulatory Visit: Payer: Self-pay | Admitting: *Deleted

## 2016-03-10 ENCOUNTER — Ambulatory Visit
Admission: RE | Admit: 2016-03-10 | Discharge: 2016-03-10 | Disposition: A | Discharge status: Home / Self Care | Payer: BLUE CROSS/BLUE SHIELD | Source: Ambulatory Visit | Attending: Radiation Oncology | Admitting: Radiation Oncology

## 2016-03-10 VITALS — BP 152/92 | HR 74 | Temp 96.7°F | Resp 20 | Wt 214.3 lb

## 2016-03-10 DIAGNOSIS — C61 Malignant neoplasm of prostate: Secondary | ICD-10-CM | POA: Insufficient documentation

## 2016-03-10 DIAGNOSIS — Z79899 Other long term (current) drug therapy: Secondary | ICD-10-CM | POA: Insufficient documentation

## 2016-03-10 DIAGNOSIS — Z923 Personal history of irradiation: Secondary | ICD-10-CM | POA: Diagnosis not present

## 2016-03-10 LAB — PSA: PSA: 0.57 ng/mL (ref 0.00–4.00)

## 2016-03-10 NOTE — Progress Notes (Signed)
Radiation Oncology Follow up Note  Name: Luis Aguilar   Date:   03/10/2016 MRN:  [id number] DOB: [birth year]    This 60 y.o. male presents to the clinic today for to have your follow-up status post pelvic radiation plus I-125 interstitial implant. For Gleason 7 adenocarcinoma presenting the PSA of 32  REFERRING PROVIDER: Dion Body, MD  HPI: Patient is a 60 year old male now out 2 and half years having completed both pelvic radiation as well as I-125 interstitial implant for boost to his prostate for Gleason 7 (3+4) adenocarcinoma the prostate presenting the PSA of 32. We've been having them on pulse Lupron therapy as his last PSA back in May 2017 was 10. He is seen today in routine follow-up is doing well. Specifically denies diarrhea dysuria or any other GI/GU complaints. His PSA was tested today..  COMPLICATIONS OF TREATMENT: none  FOLLOW UP COMPLIANCE: keeps appointments   PHYSICAL EXAM:  BP (!) 152/92   Pulse 74   Temp (!) 96.7 F (35.9 C)   Resp 20   Wt 214 lb 4.6 oz (97.2 kg)   BMI 29.89 kg/m  On rectal exam rectal sphincter tone is good. Prostate is smooth contracted without evidence of nodularity or mass. Sulcus is preserved bilaterally. No discrete nodularity is identified. No other rectal abnormalities are noted. Well-developed well-nourished patient in NAD. HEENT reveals PERLA, EOMI, discs not visualized.  Oral cavity is clear. No oral mucosal lesions are identified. Neck is clear without evidence of cervical or supraclavicular adenopathy. Lungs are clear to A&P. Cardiac examination is essentially unremarkable with regular rate and rhythm without murmur rub or thrill. Abdomen is benign with no organomegaly or masses noted. Motor sensory and DTR levels are equal and symmetric in the upper and lower extremities. Cranial nerves II through XII are grossly intact. Proprioception is intact. No peripheral adenopathy or edema is identified. No motor or sensory levels are  noted. Crude visual fields are within normal range.  RADIOLOGY RESULTS: No current films for review  PLAN: PSA was performed today and I will base my next pulse Lupron based on that value. Should it be going up significantly will refer patient to medical oncology for further treatment strategy. Otherwise will see him back in 6 months with repeat PSA for determination of further therapy. Patient is comfortable so far with our treatment plan.  I would like to take this opportunity to thank you for allowing me to participate in the care of your patient.Armstead Peaks., MD

## 2016-07-21 ENCOUNTER — Inpatient Hospital Stay: Payer: BLUE CROSS/BLUE SHIELD | Attending: Radiation Oncology | Admitting: *Deleted

## 2016-07-21 DIAGNOSIS — C61 Malignant neoplasm of prostate: Secondary | ICD-10-CM | POA: Diagnosis not present

## 2016-07-21 LAB — PSA: PSA: 0.18 ng/mL (ref 0.00–4.00)

## 2016-07-28 ENCOUNTER — Ambulatory Visit
Admission: RE | Admit: 2016-07-28 | Discharge: 2016-07-28 | Disposition: A | Discharge status: Home / Self Care | Payer: BLUE CROSS/BLUE SHIELD | Source: Ambulatory Visit | Attending: Radiation Oncology | Admitting: Radiation Oncology

## 2016-07-28 ENCOUNTER — Encounter: Payer: Self-pay | Admitting: Radiation Oncology

## 2016-07-28 VITALS — BP 106/73 | HR 87 | Temp 96.5°F | Resp 20 | Wt 214.2 lb

## 2016-07-28 DIAGNOSIS — C61 Malignant neoplasm of prostate: Secondary | ICD-10-CM | POA: Diagnosis not present

## 2016-07-28 DIAGNOSIS — Z923 Personal history of irradiation: Secondary | ICD-10-CM | POA: Insufficient documentation

## 2016-07-28 NOTE — Progress Notes (Signed)
Radiation Oncology Follow up Note  Name: Luis Aguilar   Date:   07/28/2016 MRN:  [id number] DOB: 29-Mar-1956    This 61 y.o. male presents to the clinic today for 3 year follow-up status post both pelvic radiation I-125 interstitial implant for Gleason 7 adenocarcinoma the prostate presenting the PSA of 32.  REFERRING PROVIDER: Dion Body, MD  HPI: patient is a 61 year old male now out 3 years having completed both external beam radiation therapy to his pelvis as well as I-125 interstitial implant for Gleason 7 (3+4) adenocarcinoma prostate presenting the PSA of 32. He had been on pulse Lupron therapy. His last PSA was last week 0.18. His last Lupron was October 2017 he specifically denies diarrhea dysuria or any other GI/GU complaints..  COMPLICATIONS OF TREATMENT: none  FOLLOW UP COMPLIANCE: keeps appointments   PHYSICAL EXAM:  BP 106/73   Pulse 87   Temp (!) 96.5 F (35.8 C)   Resp 20   Wt 214 lb 2.8 oz (97.1 kg)   BMI 29.87 kg/m  On rectal exam rectal sphincter tone is good. Prostate is smooth contracted without evidence of nodularity or mass. Sulcus is preserved bilaterally. No discrete nodularity is identified. No other rectal abnormalities are noted. Well-developed well-nourished patient in NAD. HEENT reveals PERLA, EOMI, discs not visualized.  Oral cavity is clear. No oral mucosal lesions are identified. Neck is clear without evidence of cervical or supraclavicular adenopathy. Lungs are clear to A&P. Cardiac examination is essentially unremarkable with regular rate and rhythm without murmur rub or thrill. Abdomen is benign with no organomegaly or masses noted. Motor sensory and DTR levels are equal and symmetric in the upper and lower extremities. Cranial nerves II through XII are grossly intact. Proprioception is intact. No peripheral adenopathy or edema is identified. No motor or sensory levels are noted. Crude visual fields are within normal range.  RADIOLOGY  RESULTS: no current films for review  PLAN: present time patient is doing well under good biochemical control of his prostate cancer. I'm please was overall progress. I've asked to see him back in a year and ordered a PSA at that time. Patient is to call sooner with any concerns.  I would like to take this opportunity to thank you for allowing me to participate in the care of your patient.Armstead Peaks., MD

## 2016-08-17 ENCOUNTER — Encounter: Payer: Self-pay | Admitting: Emergency Medicine

## 2016-08-17 ENCOUNTER — Emergency Department
Admission: EM | Admit: 2016-08-17 | Discharge: 2016-08-17 | Disposition: A | Discharge status: Home / Self Care | Payer: BLUE CROSS/BLUE SHIELD | Attending: Emergency Medicine | Admitting: Emergency Medicine

## 2016-08-17 DIAGNOSIS — K611 Rectal abscess: Secondary | ICD-10-CM | POA: Insufficient documentation

## 2016-08-17 DIAGNOSIS — Z79899 Other long term (current) drug therapy: Secondary | ICD-10-CM | POA: Insufficient documentation

## 2016-08-17 DIAGNOSIS — I10 Essential (primary) hypertension: Secondary | ICD-10-CM | POA: Diagnosis not present

## 2016-08-17 DIAGNOSIS — Z87891 Personal history of nicotine dependence: Secondary | ICD-10-CM | POA: Insufficient documentation

## 2016-08-17 DIAGNOSIS — Z7982 Long term (current) use of aspirin: Secondary | ICD-10-CM | POA: Diagnosis not present

## 2016-08-17 DIAGNOSIS — Z7984 Long term (current) use of oral hypoglycemic drugs: Secondary | ICD-10-CM | POA: Diagnosis not present

## 2016-08-17 DIAGNOSIS — E119 Type 2 diabetes mellitus without complications: Secondary | ICD-10-CM | POA: Diagnosis not present

## 2016-08-17 DIAGNOSIS — K6289 Other specified diseases of anus and rectum: Secondary | ICD-10-CM | POA: Diagnosis present

## 2016-08-17 LAB — CBC
HEMATOCRIT: 33.1 % — AB (ref 40.0–52.0)
HEMOGLOBIN: 10.9 g/dL — AB (ref 13.0–18.0)
MCH: 26.2 pg (ref 26.0–34.0)
MCHC: 33 g/dL (ref 32.0–36.0)
MCV: 79.6 fL — AB (ref 80.0–100.0)
Platelets: 340 10*3/uL (ref 150–440)
RBC: 4.16 MIL/uL — AB (ref 4.40–5.90)
RDW: 16.6 % — AB (ref 11.5–14.5)
WBC: 10.4 10*3/uL (ref 3.8–10.6)

## 2016-08-17 LAB — COMPREHENSIVE METABOLIC PANEL
ALK PHOS: 75 U/L (ref 38–126)
ALT: 18 U/L (ref 17–63)
AST: 21 U/L (ref 15–41)
Albumin: 4.3 g/dL (ref 3.5–5.0)
Anion gap: 9 (ref 5–15)
BUN: 10 mg/dL (ref 6–20)
CALCIUM: 9.5 mg/dL (ref 8.9–10.3)
CO2: 25 mmol/L (ref 22–32)
CREATININE: 0.67 mg/dL (ref 0.61–1.24)
Chloride: 103 mmol/L (ref 101–111)
GFR calc Af Amer: >60 mL/min (ref >60)
Glucose, Bld: 152 mg/dL — ABNORMAL HIGH (ref 65–99)
Potassium: 3.4 mmol/L — ABNORMAL LOW (ref 3.5–5.1)
Sodium: 137 mmol/L (ref 135–145)
TOTAL PROTEIN: 8.2 g/dL — AB (ref 6.5–8.1)
Total Bilirubin: 0.6 mg/dL (ref 0.3–1.2)

## 2016-08-17 MED ORDER — LIDOCAINE-EPINEPHRINE 2 %-1:100000 IJ SOLN
20.0000 mL | Freq: Once | INTRAMUSCULAR | Status: AC
  Administered 2016-08-17: 20 mL via INTRADERMAL
  Filled 2016-08-17: qty 20

## 2016-08-17 MED ORDER — LIDOCAINE HCL (PF) 1 % IJ SOLN
INTRAMUSCULAR | Status: AC
Start: 2016-08-17 — End: 2016-08-17
  Administered 2016-08-17: 5 mL via INTRADERMAL
  Filled 2016-08-17: qty 10

## 2016-08-17 MED ORDER — LIDOCAINE HCL (PF) 1 % IJ SOLN
5.0000 mL | Freq: Once | INTRAMUSCULAR | Status: AC
  Administered 2016-08-17: 5 mL via INTRADERMAL

## 2016-08-17 MED ORDER — LIDOCAINE-EPINEPHRINE 2 %-1:100000 IJ SOLN
30.0000 mL | Freq: Once | INTRAMUSCULAR | Status: DC
  Filled 2016-08-17: qty 30

## 2016-08-17 NOTE — ED Triage Notes (Addendum)
Pt arrived via POV from home with reports of rectal pain that started about 1 week ago. Pt states he has had this pain before about 3 years ago and had surgery. Hx of proctitis. 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  Pain worsening over the past week. Tried preparation H but made it worse. Denies any dysuria. Difficulty having a BM. Denies any bleeding.

## 2016-08-17 NOTE — Op Note (Signed)
OPERATIVE REPORT  PREOPERATIVE  DIAGNOSIS: . Perirectal abscess  POSTOPERATIVE DIAGNOSIS: . Perirectal abscess  PROCEDURE: . Incision and drainage of perirectal abscess  ANESTHESIA:  General  SURGEON: Rochel Brome  MD   INDICATIONS: . He came in the emergency room with 1 week history of anal pain and had focal findings of tenderness and swelling posterior and to the left of the anal orifice  With the patient on the stretcher in the left lateral decubitus position with hips flexed the anal area was prepared with Betadine solution. Also used a fenestrated drape right but thought was retracted by the assistant. The skin surrounding the tender area of posterior to the anal orifice was infiltrated with 10 cc of 1% Xylocaine followed by 5 cc of 2% Xylocaine with epinephrine.  A lancing incision was made 1 cm posterior to the anal orifice and slightly to the left of midline. The incision was approximately 12 mm in length. Dissection was carried down sharply with a scalpel and also scissors for traction. Upon reaching a point approximately 1.8 cm deep there was abrupt drainage of a large amount of yellow pus. This amounted to approximately 15 cc of pus. The needle-holder was inserted and went in 5 cm. Also the tract was dilated. There was moderate discomfort. A quarter-inch Penrose drain was inserted and sutured to the skin with a 3-0 nylon suture. The site was cleaned and with saline and then dressed with 4 x 4 gauze and 2 inch paper tape.  The patient tolerated the procedure satisfactorily and appeared to be in satisfactory condition. He was kept in the room for a brief period of observation and then discharged in satisfactory condition. See instructions

## 2016-08-17 NOTE — Discharge Instructions (Signed)
You have been seen in the emergency department today for rectal pain due to an abscess. Please remove the dressing tomorrow morning. Please soak the area and warm water twice daily. Please use Tylenol or Aleve at home as needed for discomfort, as written on the box. Please call the number for Dr. Tamala Julian to arrange a follow-up appointment in approximately 10 days. Return to the emergency department for any worsening pain, fever, or any other symptom personally concerning to yourself.
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
## 2016-08-17 NOTE — ED Notes (Signed)
Surgery consult at bedside.

## 2016-09-11 ENCOUNTER — Ambulatory Visit (INDEPENDENT_AMBULATORY_CARE_PROVIDER_SITE_OTHER): Payer: BLUE CROSS/BLUE SHIELD | Admitting: General Surgery

## 2016-09-11 ENCOUNTER — Encounter: Payer: Self-pay | Admitting: General Surgery

## 2016-09-11 VITALS — BP 140/90 | HR 92 | Resp 14 | Ht 72.0 in | Wt 216.0 lb

## 2016-09-11 DIAGNOSIS — K610 Anal abscess: Secondary | ICD-10-CM | POA: Diagnosis not present
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
## 2016-09-11 NOTE — Patient Instructions (Addendum)
Follow up in 6 weeks or sooner if symptoms worsen The patient is aware to call back for any questions or concerns

## 2016-10-28 ENCOUNTER — Ambulatory Visit
Admission: EM | Admit: 2016-10-28 | Discharge: 2016-10-28 | Disposition: A | Discharge status: Home / Self Care | Payer: BLUE CROSS/BLUE SHIELD | Attending: Family Medicine | Admitting: Family Medicine

## 2016-10-28 ENCOUNTER — Encounter: Payer: Self-pay | Admitting: Emergency Medicine

## 2016-10-28 DIAGNOSIS — Z0289 Encounter for other administrative examinations: Secondary | ICD-10-CM

## 2016-10-28 DIAGNOSIS — Z024 Encounter for examination for driving license: Secondary | ICD-10-CM

## 2016-10-28 LAB — DEPT OF TRANSP DIPSTICK, URINE (ARMC ONLY)
Glucose, UA: NEGATIVE mg/dL
Protein, ur: NEGATIVE mg/dL
Specific Gravity, Urine: 1.025 (ref 1.005–1.030)

## 2016-10-28 NOTE — ED Triage Notes (Signed)
Patient here for DOT Physical.  
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
## 2016-10-29 ENCOUNTER — Ambulatory Visit: Payer: BLUE CROSS/BLUE SHIELD | Admitting: General Surgery

## 2016-11-19 ENCOUNTER — Encounter: Payer: Self-pay | Admitting: General Surgery

## 2016-11-19 ENCOUNTER — Ambulatory Visit (INDEPENDENT_AMBULATORY_CARE_PROVIDER_SITE_OTHER): Payer: BLUE CROSS/BLUE SHIELD | Admitting: General Surgery

## 2016-11-19 VITALS — BP 118/78 | HR 89 | Resp 12 | Ht 72.0 in | Wt 212.0 lb

## 2016-11-19 DIAGNOSIS — K610 Anal abscess: Secondary | ICD-10-CM | POA: Diagnosis not present
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
## 2016-11-19 NOTE — Patient Instructions (Signed)
Return as needed

## 2017-07-27 ENCOUNTER — Other Ambulatory Visit: Payer: Self-pay

## 2017-07-28 ENCOUNTER — Other Ambulatory Visit: Payer: Self-pay

## 2017-07-28 ENCOUNTER — Inpatient Hospital Stay: Payer: BLUE CROSS/BLUE SHIELD | Attending: Radiation Oncology

## 2017-07-28 DIAGNOSIS — C61 Malignant neoplasm of prostate: Secondary | ICD-10-CM | POA: Insufficient documentation

## 2017-07-28 LAB — PSA: Prostatic Specific Antigen: 9.15 ng/mL — ABNORMAL HIGH (ref 0.00–4.00)

## 2017-07-30 ENCOUNTER — Other Ambulatory Visit: Payer: Self-pay

## 2017-08-03 ENCOUNTER — Ambulatory Visit: Payer: Self-pay | Admitting: Radiation Oncology

## 2017-08-06 ENCOUNTER — Ambulatory Visit
Admission: RE | Admit: 2017-08-06 | Discharge: 2017-08-06 | Disposition: A | Discharge status: Home / Self Care | Payer: BLUE CROSS/BLUE SHIELD | Source: Ambulatory Visit | Attending: Radiation Oncology | Admitting: Radiation Oncology

## 2017-08-06 ENCOUNTER — Other Ambulatory Visit: Payer: Self-pay | Admitting: *Deleted

## 2017-08-06 ENCOUNTER — Other Ambulatory Visit: Payer: Self-pay

## 2017-08-06 ENCOUNTER — Inpatient Hospital Stay: Payer: BLUE CROSS/BLUE SHIELD

## 2017-08-06 VITALS — BP 134/88 | Temp 98.0°F | Resp 12 | Ht 72.0 in | Wt 216.9 lb

## 2017-08-06 DIAGNOSIS — Z923 Personal history of irradiation: Secondary | ICD-10-CM | POA: Diagnosis not present

## 2017-08-06 DIAGNOSIS — R9721 Rising PSA following treatment for malignant neoplasm of prostate: Secondary | ICD-10-CM | POA: Diagnosis not present

## 2017-08-06 DIAGNOSIS — C61 Malignant neoplasm of prostate: Secondary | ICD-10-CM

## 2017-08-06 LAB — PSA: Prostatic Specific Antigen: 10.56 ng/mL — ABNORMAL HIGH (ref 0.00–4.00)

## 2017-08-06 NOTE — Progress Notes (Signed)
Radiation Oncology Follow up Note  Name: Luis Aguilar   Date:   08/06/2017 MRN:  [id number] DOB: 08/03/1955    This 62 y.o. male presents to the clinic today for for your follow-up status post radiation therapy for locally advanced high risk Gleason 7 adenocarcinoma the prostatepresenting initially with a PSA of 32.  REFERRING PROVIDER: Dion Body, MD  HPI: patient is a 61 year old male now out 4 years having completed both external beam radiation therapy to both prostate and pelvic nodes plus I-125 interstitial implant for boost for Gleason 7 (3+4) adenocarcinoma of the prostate presenting the PSA of 32..I have been treating him with pulse Lupron therapy as his PSA jumped to 10 back in May 2017 re-normalized 0.18 back in April 2018 although last week Back to 9.15. I am repeating his PSA today to ascertain validity of the last PSA check. He is asymptomatic specifically denies any lower urinary tract symptoms diarrhea or bone pain.   COMPLICATIONS OF TREATMENT: none  FOLLOW UP COMPLIANCE: keeps appointments   PHYSICAL EXAM:  BP 134/88 (BP Location: Left Arm, Patient Position: Sitting, Cuff Size: Large)   Temp 98 F (36.7 C) (Tympanic)   Resp 12   Ht 6' (1.829 m)   Wt 216 lb 14.9 oz (98.4 kg)   BMI 29.42 kg/m  Well-developed well-nourished patient in NAD. HEENT reveals PERLA, EOMI, discs not visualized.  Oral cavity is clear. No oral mucosal lesions are identified. Neck is clear without evidence of cervical or supraclavicular adenopathy. Lungs are clear to A&P. Cardiac examination is essentially unremarkable with regular rate and rhythm without murmur rub or thrill. Abdomen is benign with no organomegaly or masses noted. Motor sensory and DTR levels are equal and symmetric in the upper and lower extremities. Cranial nerves II through XII are grossly intact. Proprioception is intact. No peripheral adenopathy or edema is identified. No motor or sensory levels are noted. Crude visual  fields are within normal range.  RADIOLOGY RESULTS: bone scan ordered  PLAN: at this time I ordered anotherfour-month Lupron Dep[place] injection. I've also ordered a bone scan for baseline study. I'm also referring the patient to medical oncology for evaluation. Patient comprehends our treatment plan well. I've otherwise asked to see him back in 6 months for follow-up.  I would like to take this opportunity to thank you for allowing me to participate in the care of your patient.Noreene Filbert, MD

## 2017-08-13 DIAGNOSIS — C61 Malignant neoplasm of prostate: Secondary | ICD-10-CM | POA: Insufficient documentation
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
## 2017-08-14 ENCOUNTER — Encounter
Admission: RE | Admit: 2017-08-14 | Discharge: 2017-08-14 | Disposition: A | Discharge status: Home / Self Care | Payer: BLUE CROSS/BLUE SHIELD | Source: Ambulatory Visit | Attending: Radiation Oncology | Admitting: Radiation Oncology

## 2017-08-14 ENCOUNTER — Inpatient Hospital Stay: Payer: BLUE CROSS/BLUE SHIELD | Attending: Internal Medicine | Admitting: Internal Medicine

## 2017-08-14 ENCOUNTER — Other Ambulatory Visit: Payer: Self-pay | Admitting: *Deleted

## 2017-08-14 ENCOUNTER — Inpatient Hospital Stay: Payer: BLUE CROSS/BLUE SHIELD

## 2017-08-14 DIAGNOSIS — E119 Type 2 diabetes mellitus without complications: Secondary | ICD-10-CM | POA: Diagnosis not present

## 2017-08-14 DIAGNOSIS — C61 Malignant neoplasm of prostate: Secondary | ICD-10-CM

## 2017-08-14 DIAGNOSIS — E7800 Pure hypercholesterolemia, unspecified: Secondary | ICD-10-CM

## 2017-08-14 DIAGNOSIS — Z79899 Other long term (current) drug therapy: Secondary | ICD-10-CM

## 2017-08-14 DIAGNOSIS — I10 Essential (primary) hypertension: Secondary | ICD-10-CM

## 2017-08-14 DIAGNOSIS — Z7982 Long term (current) use of aspirin: Secondary | ICD-10-CM

## 2017-08-14 DIAGNOSIS — Z87891 Personal history of nicotine dependence: Secondary | ICD-10-CM | POA: Diagnosis not present

## 2017-08-14 DIAGNOSIS — N400 Enlarged prostate without lower urinary tract symptoms: Secondary | ICD-10-CM | POA: Diagnosis not present

## 2017-08-14 DIAGNOSIS — Z79818 Long term (current) use of other agents affecting estrogen receptors and estrogen levels: Secondary | ICD-10-CM | POA: Diagnosis not present

## 2017-08-14 DIAGNOSIS — Z7984 Long term (current) use of oral hypoglycemic drugs: Secondary | ICD-10-CM

## 2017-08-14 MED ORDER — TECHNETIUM TC 99M MEDRONATE IV KIT
20.0000 | PACK | Freq: Once | INTRAVENOUS | Status: AC | PRN
  Administered 2017-08-14: 23.37 via INTRAVENOUS

## 2017-08-14 MED ORDER — LEUPROLIDE ACETATE (4 MONTH) 30 MG IM KIT
30.0000 mg | PACK | Freq: Once | INTRAMUSCULAR | Status: AC
  Administered 2017-08-14: 30 mg via INTRAMUSCULAR

## 2017-08-14 NOTE — Assessment & Plan Note (Addendum)
Addendum #Prostate cancer-biochemical recurrence based on elevated PSA [~10-April 2019].  Patient is awaiting to get the results of the bone scan ordered by radiation oncology.  #Given the patient's high risk disease [at baseline]; and also the fact that PSA recurrence happened approximately 2 years post definitive treatment/radiation; and also the fact patient is healthy/young-I agree with early initiation of ADT.   # Patient is currently started on Lupron;  He received Lupron 30 mg today [08/14/2017].  Continuous versus intermittent ADT are options-which has to be based upon patient's side effects profile.  #I had a long discussion the patient regarding the recurrence -which means this is usually incurable.  However, patient could have a long life expectancy  which would naturally depend upon trajectory of the course of his disease.   #Symptoms of prostatism-currently improved on Flomax.  # Follow up to be decided. Pt will call re: bone scan.   Thank you Dr.Crystal  for allowing me to participate in the care of your pleasant patient. Please do not hesitate to contact me with questions or concerns in the interim.  # 60 minutes face-to-face with the patient discussing the above plan of care; more than 50% of time spent on prognosis/ natural history; counseling and coordination.  Addendum: Patient's bone scan was reviewed; negative for metastasis.  I would recommend aux PET scan for further evaluation-to evaluate the possible role of any salvage options.

## 2017-08-16 ENCOUNTER — Telehealth: Payer: Self-pay | Admitting: Internal Medicine

## 2017-08-16 NOTE — Telephone Encounter (Signed)
Heather/Brooke-please inform patient that bone scan is negative for any evidence of cancer.  I would recommend a PET scan for further evaluation.   Collete- please schedule the PET scan/also schedule a follow-up with me 3 days after the PET scan is done. Thx

## 2017-08-17 NOTE — Telephone Encounter (Signed)
RN spoke with patient regarding results. Patient gave verbal understanding of the plan of care and need for pet scan.

## 2017-08-27 ENCOUNTER — Telehealth: Payer: Self-pay | Admitting: Internal Medicine

## 2017-08-27 DIAGNOSIS — C61 Malignant neoplasm of prostate: Secondary | ICD-10-CM

## 2017-08-27 NOTE — Telephone Encounter (Signed)
PET denied; please cancel PET.   Instead- schedule CT A/P with contrast- Authorized/NUmber-- [id number]-  [from may 15 th thru June 13th]

## 2017-09-04 ENCOUNTER — Ambulatory Visit
Admission: RE | Admit: 2017-09-04 | Discharge: 2017-09-04 | Disposition: A | Discharge status: Home / Self Care | Payer: BLUE CROSS/BLUE SHIELD | Source: Ambulatory Visit | Attending: Internal Medicine | Admitting: Internal Medicine

## 2017-09-04 DIAGNOSIS — I700 Atherosclerosis of aorta: Secondary | ICD-10-CM | POA: Insufficient documentation

## 2017-09-04 DIAGNOSIS — M899 Disorder of bone, unspecified: Secondary | ICD-10-CM | POA: Insufficient documentation

## 2017-09-04 DIAGNOSIS — C61 Malignant neoplasm of prostate: Secondary | ICD-10-CM | POA: Insufficient documentation

## 2017-09-04 DIAGNOSIS — E279 Disorder of adrenal gland, unspecified: Secondary | ICD-10-CM | POA: Insufficient documentation

## 2017-09-04 DIAGNOSIS — R590 Localized enlarged lymph nodes: Secondary | ICD-10-CM | POA: Insufficient documentation

## 2017-09-04 LAB — POCT I-STAT CREATININE: Creatinine, Ser: 1.1 mg/dL (ref 0.61–1.24)

## 2017-09-04 MED ORDER — IOPAMIDOL (ISOVUE-300) INJECTION 61%
100.0000 mL | Freq: Once | INTRAVENOUS | Status: AC | PRN
  Administered 2017-09-04: 100 mL via INTRAVENOUS

## 2017-09-09 ENCOUNTER — Telehealth: Payer: Self-pay | Admitting: Internal Medicine

## 2017-09-09 ENCOUNTER — Telehealth: Payer: Self-pay | Admitting: *Deleted

## 2017-09-09 NOTE — Telephone Encounter (Signed)
Patient calling to request ct scan results. Msg given to Dr. Rogue Bussing

## 2017-09-09 NOTE — Telephone Encounter (Signed)
Please schedule appointment with me on June 4/Tuesday-[place] at 3:00 pm; no labs.   Please confirm appt with pt. Thx. GB

## 2017-09-10 NOTE — Telephone Encounter (Signed)
Juanjose Mojica is on the schedule for Md / no labs Tuesday 6/4 @ 3 pm, confirmed with Veverly Fells!

## 2017-09-15 ENCOUNTER — Other Ambulatory Visit: Payer: Self-pay

## 2017-09-15 ENCOUNTER — Encounter: Payer: Self-pay | Admitting: Internal Medicine

## 2017-09-15 ENCOUNTER — Inpatient Hospital Stay: Payer: BLUE CROSS/BLUE SHIELD | Attending: Internal Medicine | Admitting: Internal Medicine

## 2017-09-15 VITALS — BP 146/88 | HR 73 | Temp 97.6°F | Resp 20 | Ht 72.0 in | Wt 216.1 lb

## 2017-09-15 DIAGNOSIS — Z87891 Personal history of nicotine dependence: Secondary | ICD-10-CM | POA: Insufficient documentation

## 2017-09-15 DIAGNOSIS — Z191 Hormone sensitive malignancy status: Secondary | ICD-10-CM | POA: Diagnosis not present

## 2017-09-15 DIAGNOSIS — D509 Iron deficiency anemia, unspecified: Secondary | ICD-10-CM | POA: Diagnosis not present

## 2017-09-15 DIAGNOSIS — Z7901 Long term (current) use of anticoagulants: Secondary | ICD-10-CM | POA: Insufficient documentation

## 2017-09-15 DIAGNOSIS — E7800 Pure hypercholesterolemia, unspecified: Secondary | ICD-10-CM | POA: Insufficient documentation

## 2017-09-15 DIAGNOSIS — D649 Anemia, unspecified: Secondary | ICD-10-CM | POA: Insufficient documentation

## 2017-09-15 DIAGNOSIS — I10 Essential (primary) hypertension: Secondary | ICD-10-CM | POA: Insufficient documentation

## 2017-09-15 DIAGNOSIS — R6882 Decreased libido: Secondary | ICD-10-CM | POA: Insufficient documentation

## 2017-09-15 DIAGNOSIS — Z7984 Long term (current) use of oral hypoglycemic drugs: Secondary | ICD-10-CM | POA: Diagnosis not present

## 2017-09-15 DIAGNOSIS — Z79899 Other long term (current) drug therapy: Secondary | ICD-10-CM | POA: Diagnosis not present

## 2017-09-15 DIAGNOSIS — Z7982 Long term (current) use of aspirin: Secondary | ICD-10-CM

## 2017-09-15 DIAGNOSIS — E119 Type 2 diabetes mellitus without complications: Secondary | ICD-10-CM | POA: Diagnosis not present

## 2017-09-15 DIAGNOSIS — Z86010 Personal history of colonic polyps: Secondary | ICD-10-CM | POA: Insufficient documentation

## 2017-09-15 DIAGNOSIS — C61 Malignant neoplasm of prostate: Secondary | ICD-10-CM

## 2017-09-15 DIAGNOSIS — Z8719 Personal history of other diseases of the digestive system: Secondary | ICD-10-CM

## 2017-09-15 NOTE — Assessment & Plan Note (Addendum)
#  Recurrent prostate cancer/castrate sensitive; stage IV; CT scan May 2019 -Retroperitoneal lymph nodes ~15 mm  #Continue Lupron; tolerating well.  Continue Lupron every 4 months  #I had a long discussion with the patient regarding initiation of further systemic therapy in addition to Lupron.  I discussed the data of the charted trial; stampede trial; latitude trial at length.  Patient has low volume disease-given the absence of bone mets; and as per the charted trial no significant benefit was noted with docetaxel.  However stampede trial arm C/G-arm showed improvement of metastasis free survival in both docetaxel/abiraterone.  Again metastasis free survival was improved with abiraterone-as per the latitude trial.  #Pros and cons of each treatment option was discussed.  Chemotherapy side effects of docetaxel was discussed; along with the benefit of limited therapy.  Zytiga-is well-tolerated although it is potential side effects of hypertension/hypokalemia/fluid retention; but it is indefinite therapy.  Patient out-of-pocket expense should also be considered while making the decision.  # Patient is currently started on Lupron;  He received Lupron 30 mg today [08/14/2017].  Continuous versus intermittent ADT are options-which has to be based upon patient's side effects profile.  #At the end patient was interested in treatment options; however still not sure which option he would opt for.   # follow up in 8 week/ Friday; lab- PSA.   # 40 minutes face-to-face with the patient discussing the above plan of care; more than 50% of time spent on prognosis/ natural history; counseling and coordination.
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
## 2017-11-09 ENCOUNTER — Telehealth: Payer: Self-pay | Admitting: *Deleted

## 2017-11-09 NOTE — Telephone Encounter (Signed)
-----   Message from Cephus Richer sent at 11/09/2017  7:54 AM EDT ----- Regarding: [facility] Per pt cancel all appt. He is going to [facility].  Thanks

## 2017-11-10 ENCOUNTER — Other Ambulatory Visit: Payer: Self-pay

## 2017-11-13 ENCOUNTER — Ambulatory Visit: Payer: Self-pay | Admitting: Internal Medicine

## 2018-08-01 ENCOUNTER — Observation Stay
Admission: EM | Admit: 2018-08-01 | Discharge: 2018-08-03 | Disposition: A | Discharge status: Home / Self Care | Payer: BLUE CROSS/BLUE SHIELD | Attending: General Surgery | Admitting: General Surgery

## 2018-08-01 ENCOUNTER — Other Ambulatory Visit: Payer: Self-pay

## 2018-08-01 ENCOUNTER — Emergency Department: Payer: BLUE CROSS/BLUE SHIELD

## 2018-08-01 ENCOUNTER — Encounter: Payer: Self-pay | Admitting: Emergency Medicine

## 2018-08-01 DIAGNOSIS — Z923 Personal history of irradiation: Secondary | ICD-10-CM | POA: Insufficient documentation

## 2018-08-01 DIAGNOSIS — C61 Malignant neoplasm of prostate: Secondary | ICD-10-CM | POA: Diagnosis not present

## 2018-08-01 DIAGNOSIS — I10 Essential (primary) hypertension: Secondary | ICD-10-CM | POA: Diagnosis not present

## 2018-08-01 DIAGNOSIS — Z7982 Long term (current) use of aspirin: Secondary | ICD-10-CM | POA: Diagnosis not present

## 2018-08-01 DIAGNOSIS — N400 Enlarged prostate without lower urinary tract symptoms: Secondary | ICD-10-CM | POA: Insufficient documentation

## 2018-08-01 DIAGNOSIS — Z79899 Other long term (current) drug therapy: Secondary | ICD-10-CM | POA: Insufficient documentation

## 2018-08-01 DIAGNOSIS — E119 Type 2 diabetes mellitus without complications: Secondary | ICD-10-CM | POA: Insufficient documentation

## 2018-08-01 DIAGNOSIS — E7800 Pure hypercholesterolemia, unspecified: Secondary | ICD-10-CM | POA: Insufficient documentation

## 2018-08-01 DIAGNOSIS — D509 Iron deficiency anemia, unspecified: Secondary | ICD-10-CM | POA: Diagnosis not present

## 2018-08-01 DIAGNOSIS — Z7952 Long term (current) use of systemic steroids: Secondary | ICD-10-CM | POA: Insufficient documentation

## 2018-08-01 DIAGNOSIS — Z79818 Long term (current) use of other agents affecting estrogen receptors and estrogen levels: Secondary | ICD-10-CM | POA: Diagnosis not present

## 2018-08-01 DIAGNOSIS — K3580 Unspecified acute appendicitis: Principal | ICD-10-CM | POA: Diagnosis present

## 2018-08-01 DIAGNOSIS — Z7984 Long term (current) use of oral hypoglycemic drugs: Secondary | ICD-10-CM | POA: Insufficient documentation

## 2018-08-01 DIAGNOSIS — N529 Male erectile dysfunction, unspecified: Secondary | ICD-10-CM | POA: Insufficient documentation

## 2018-08-01 DIAGNOSIS — K3532 Acute appendicitis with perforation and localized peritonitis, without abscess: Secondary | ICD-10-CM

## 2018-08-01 DIAGNOSIS — Z87891 Personal history of nicotine dependence: Secondary | ICD-10-CM | POA: Diagnosis not present

## 2018-08-01 HISTORY — DX: Unspecified acute appendicitis: K35.80

## 2018-08-01 LAB — CBC
HCT: 36.1 % — ABNORMAL LOW (ref 39.0–52.0)
Hemoglobin: 11.3 g/dL — ABNORMAL LOW (ref 13.0–17.0)
MCH: 25.5 pg — ABNORMAL LOW (ref 26.0–34.0)
MCHC: 31.3 g/dL (ref 30.0–36.0)
MCV: 81.3 fL (ref 80.0–100.0)
Platelets: 367 10*3/uL (ref 150–400)
RBC: 4.44 MIL/uL (ref 4.22–5.81)
RDW: 14.8 % (ref 11.5–15.5)
WBC: 13.2 10*3/uL — ABNORMAL HIGH (ref 4.0–10.5)
nRBC: 0 % (ref 0.0–0.2)

## 2018-08-01 LAB — COMPREHENSIVE METABOLIC PANEL
ALT: 11 U/L (ref 0–44)
AST: 14 U/L — ABNORMAL LOW (ref 15–41)
Albumin: 4.1 g/dL (ref 3.5–5.0)
Alkaline Phosphatase: 73 U/L (ref 38–126)
Anion gap: 12 (ref 5–15)
BUN: 14 mg/dL (ref 8–23)
CO2: 22 mmol/L (ref 22–32)
Calcium: 9 mg/dL (ref 8.9–10.3)
Chloride: 105 mmol/L (ref 98–111)
Creatinine, Ser: 0.91 mg/dL (ref 0.61–1.24)
GFR calc Af Amer: >60 mL/min (ref >60)
GFR calc non Af Amer: >60 mL/min (ref >60)
Glucose, Bld: 186 mg/dL — ABNORMAL HIGH (ref 70–99)
Potassium: 3.2 mmol/L — ABNORMAL LOW (ref 3.5–5.1)
Sodium: 139 mmol/L (ref 135–145)
Total Bilirubin: 0.3 mg/dL (ref 0.3–1.2)
Total Protein: 7.5 g/dL (ref 6.5–8.1)

## 2018-08-01 LAB — URINALYSIS, COMPLETE (UACMP) WITH MICROSCOPIC
Bacteria, UA: NONE SEEN
Bilirubin Urine: NEGATIVE
Glucose, UA: NEGATIVE mg/dL
Ketones, ur: NEGATIVE mg/dL
Leukocytes,Ua: NEGATIVE
Nitrite: NEGATIVE
Protein, ur: NEGATIVE mg/dL
Specific Gravity, Urine: 1.028 (ref 1.005–1.030)
pH: 5 (ref 5.0–8.0)

## 2018-08-01 LAB — SURGICAL PCR SCREEN
MRSA, PCR: NEGATIVE
Staphylococcus aureus: NEGATIVE

## 2018-08-01 LAB — LIPASE, BLOOD: Lipase: 28 U/L (ref 11–51)

## 2018-08-01 MED ORDER — PIPERACILLIN-TAZOBACTAM 3.375 G IVPB
3.3750 g | Freq: Three times a day (TID) | INTRAVENOUS | Status: DC
  Administered 2018-08-02 (×2): 3.375 g via INTRAVENOUS
  Filled 2018-08-01: qty 50

## 2018-08-01 MED ORDER — PIPERACILLIN-TAZOBACTAM 3.375 G IVPB 30 MIN: 3.3750 g | Freq: Four times a day (QID) | INTRAVENOUS | Status: DC

## 2018-08-01 MED ORDER — LACTATED RINGERS IV SOLN
INTRAVENOUS | Status: DC
  Administered 2018-08-01 – 2018-08-02 (×2): via INTRAVENOUS

## 2018-08-01 MED ORDER — SODIUM CHLORIDE 0.9 % IV SOLN
INTRAVENOUS | Status: DC | PRN
  Administered 2018-08-02: 1000 mL via INTRAVENOUS
  Administered 2018-08-02 (×2): 250 mL via INTRAVENOUS

## 2018-08-01 MED ORDER — PIPERACILLIN-TAZOBACTAM 3.375 G IVPB 30 MIN
3.3750 g | Freq: Once | INTRAVENOUS | Status: AC
  Administered 2018-08-01: 3.375 g via INTRAVENOUS
  Filled 2018-08-01: qty 50

## 2018-08-01 MED ORDER — ONDANSETRON HCL 4 MG/2ML IJ SOLN
4.0000 mg | Freq: Once | INTRAMUSCULAR | Status: AC
  Administered 2018-08-01: 4 mg via INTRAVENOUS
  Filled 2018-08-01: qty 2

## 2018-08-01 MED ORDER — ACETAMINOPHEN 500 MG PO TABS
1000.0000 mg | ORAL_TABLET | Freq: Four times a day (QID) | ORAL | Status: DC
  Administered 2018-08-01 – 2018-08-02 (×2): 1000 mg via ORAL
  Filled 2018-08-01 (×2): qty 2

## 2018-08-01 MED ORDER — SODIUM CHLORIDE 0.9% FLUSH
3.0000 mL | Freq: Once | INTRAVENOUS | Status: AC
  Administered 2018-08-01: 3 mL via INTRAVENOUS

## 2018-08-01 MED ORDER — MUPIROCIN 2 % EX OINT
1.0000 "application " | TOPICAL_OINTMENT | Freq: Two times a day (BID) | CUTANEOUS | Status: DC
  Filled 2018-08-01: qty 22

## 2018-08-01 MED ORDER — HYDROMORPHONE HCL 1 MG/ML IJ SOLN: 0.5000 mg | INTRAMUSCULAR | Status: DC | PRN

## 2018-08-01 MED ORDER — KETOROLAC TROMETHAMINE 30 MG/ML IJ SOLN: 30.0000 mg | Freq: Four times a day (QID) | INTRAMUSCULAR | Status: DC | PRN

## 2018-08-01 MED ORDER — ONDANSETRON 4 MG PO TBDP: 4.0000 mg | ORAL_TABLET | Freq: Four times a day (QID) | ORAL | Status: DC | PRN

## 2018-08-01 MED ORDER — IOHEXOL 300 MG/ML  SOLN
100.0000 mL | Freq: Once | INTRAMUSCULAR | Status: AC | PRN
  Administered 2018-08-01: 100 mL via INTRAVENOUS

## 2018-08-01 MED ORDER — SODIUM CHLORIDE 0.9 % IV BOLUS
1000.0000 mL | Freq: Once | INTRAVENOUS | Status: AC
  Administered 2018-08-01: 1000 mL via INTRAVENOUS

## 2018-08-01 MED ORDER — FENTANYL CITRATE (PF) 100 MCG/2ML IJ SOLN
50.0000 ug | Freq: Once | INTRAMUSCULAR | Status: AC
  Administered 2018-08-01: 50 ug via INTRAVENOUS
  Filled 2018-08-01: qty 2

## 2018-08-01 MED ORDER — ONDANSETRON HCL 4 MG/2ML IJ SOLN: 4.0000 mg | Freq: Four times a day (QID) | INTRAMUSCULAR | Status: DC | PRN
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
## 2018-08-01 NOTE — ED Notes (Signed)
Report received. Pt sitting comfortably in bed, vss, awaiting bed.

## 2018-08-01 NOTE — ED Notes (Signed)
Report called - Luis Aguilar calling to verify if pt going to surgery any time soon - pt is not scheduled for tonight.

## 2018-08-01 NOTE — ED Triage Notes (Signed)
Pt to ED via POV c/o RLQ abd pain that started this morning. Pt denies N/V/D. Pt states that he did have fever at home. Pt is in NAD.
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
## 2018-08-02 ENCOUNTER — Encounter
Admission: EM | Disposition: A | Discharge status: Home / Self Care | Payer: Self-pay | Source: Home / Self Care | Attending: Emergency Medicine

## 2018-08-02 ENCOUNTER — Observation Stay: Payer: BLUE CROSS/BLUE SHIELD | Admitting: Anesthesiology

## 2018-08-02 ENCOUNTER — Encounter: Payer: Self-pay | Admitting: *Deleted

## 2018-08-02 DIAGNOSIS — K3532 Acute appendicitis with perforation and localized peritonitis, without abscess: Secondary | ICD-10-CM | POA: Diagnosis not present

## 2018-08-02 DIAGNOSIS — K3580 Unspecified acute appendicitis: Secondary | ICD-10-CM

## 2018-08-02 HISTORY — PX: LAPAROSCOPIC APPENDECTOMY: SHX408

## 2018-08-02 LAB — GLUCOSE, CAPILLARY
Glucose-Capillary: 119 mg/dL — ABNORMAL HIGH (ref 70–99)
Glucose-Capillary: 150 mg/dL — ABNORMAL HIGH (ref 70–99)
Glucose-Capillary: 187 mg/dL — ABNORMAL HIGH (ref 70–99)
Glucose-Capillary: 239 mg/dL — ABNORMAL HIGH (ref 70–99)

## 2018-08-02 LAB — BASIC METABOLIC PANEL
Anion gap: 7 (ref 5–15)
BUN: 13 mg/dL (ref 8–23)
CO2: 25 mmol/L (ref 22–32)
Calcium: 8.7 mg/dL — ABNORMAL LOW (ref 8.9–10.3)
Chloride: 108 mmol/L (ref 98–111)
Creatinine, Ser: 0.86 mg/dL (ref 0.61–1.24)
GFR calc Af Amer: >60 mL/min (ref >60)
GFR calc non Af Amer: >60 mL/min (ref >60)
Glucose, Bld: 131 mg/dL — ABNORMAL HIGH (ref 70–99)
Potassium: 3.2 mmol/L — ABNORMAL LOW (ref 3.5–5.1)
Sodium: 140 mmol/L (ref 135–145)

## 2018-08-02 LAB — MAGNESIUM: Magnesium: 2.1 mg/dL (ref 1.7–2.4)

## 2018-08-02 LAB — CBC
HCT: 32.5 % — ABNORMAL LOW (ref 39.0–52.0)
Hemoglobin: 10 g/dL — ABNORMAL LOW (ref 13.0–17.0)
MCH: 25.3 pg — ABNORMAL LOW (ref 26.0–34.0)
MCHC: 30.8 g/dL (ref 30.0–36.0)
MCV: 82.1 fL (ref 80.0–100.0)
Platelets: 316 10*3/uL (ref 150–400)
RBC: 3.96 MIL/uL — ABNORMAL LOW (ref 4.22–5.81)
RDW: 15 % (ref 11.5–15.5)
WBC: 10.4 10*3/uL (ref 4.0–10.5)
nRBC: 0 % (ref 0.0–0.2)

## 2018-08-02 LAB — PHOSPHORUS: Phosphorus: 4.7 mg/dL — ABNORMAL HIGH (ref 2.5–4.6)

## 2018-08-02 SURGERY — APPENDECTOMY, LAPAROSCOPIC
Anesthesia: General

## 2018-08-02 MED ORDER — LACTATED RINGERS IV SOLN
INTRAVENOUS | Status: DC | PRN
  Administered 2018-08-02: 12:00:00 via INTRAVENOUS

## 2018-08-02 MED ORDER — ENALAPRIL MALEATE 20 MG PO TABS
20.0000 mg | ORAL_TABLET | Freq: Every day | ORAL | Status: DC
  Administered 2018-08-02 – 2018-08-03 (×2): 20 mg via ORAL
  Filled 2018-08-02 (×2): qty 1

## 2018-08-02 MED ORDER — MIDAZOLAM HCL 2 MG/2ML IJ SOLN
INTRAMUSCULAR | Status: DC | PRN
  Administered 2018-08-02: 2 mg via INTRAVENOUS

## 2018-08-02 MED ORDER — TAMSULOSIN HCL 0.4 MG PO CAPS
0.4000 mg | ORAL_CAPSULE | Freq: Every day | ORAL | Status: DC
  Administered 2018-08-02: 22:00:00 0.4 mg via ORAL
  Filled 2018-08-02: qty 1

## 2018-08-02 MED ORDER — ROCURONIUM BROMIDE 100 MG/10ML IV SOLN
INTRAVENOUS | Status: DC | PRN
  Administered 2018-08-02: 50 mg via INTRAVENOUS
  Administered 2018-08-02 (×2): 10 mg via INTRAVENOUS
  Administered 2018-08-02: 20 mg via INTRAVENOUS

## 2018-08-02 MED ORDER — ROCURONIUM BROMIDE 50 MG/5ML IV SOLN
INTRAVENOUS | Status: AC
  Filled 2018-08-02: qty 1

## 2018-08-02 MED ORDER — FENTANYL CITRATE (PF) 100 MCG/2ML IJ SOLN
INTRAMUSCULAR | Status: AC
  Filled 2018-08-02: qty 2

## 2018-08-02 MED ORDER — SUGAMMADEX SODIUM 200 MG/2ML IV SOLN
INTRAVENOUS | Status: DC | PRN
  Administered 2018-08-02: 200 mg via INTRAVENOUS

## 2018-08-02 MED ORDER — PIPERACILLIN-TAZOBACTAM 3.375 G IVPB
INTRAVENOUS | Status: AC
  Filled 2018-08-02: qty 50

## 2018-08-02 MED ORDER — OXYCODONE HCL 5 MG/5ML PO SOLN: 5.0000 mg | Freq: Once | ORAL | Status: DC | PRN

## 2018-08-02 MED ORDER — KETOROLAC TROMETHAMINE 15 MG/ML IJ SOLN
15.0000 mg | Freq: Four times a day (QID) | INTRAMUSCULAR | Status: DC
  Administered 2018-08-02 – 2018-08-03 (×3): 15 mg via INTRAVENOUS
  Filled 2018-08-02 (×4): qty 1

## 2018-08-02 MED ORDER — POTASSIUM CHLORIDE IN NACL 20-0.45 MEQ/L-% IV SOLN
INTRAVENOUS | Status: DC
  Administered 2018-08-02 – 2018-08-03 (×2): via INTRAVENOUS
  Filled 2018-08-02 (×4): qty 1000

## 2018-08-02 MED ORDER — MEPERIDINE HCL 50 MG/ML IJ SOLN: 6.2500 mg | INTRAMUSCULAR | Status: DC | PRN

## 2018-08-02 MED ORDER — LIDOCAINE HCL 1 % IJ SOLN
INTRAMUSCULAR | Status: DC | PRN
  Administered 2018-08-02: 20 mL via INTRAMUSCULAR

## 2018-08-02 MED ORDER — ACETAMINOPHEN 500 MG PO TABS
1000.0000 mg | ORAL_TABLET | Freq: Four times a day (QID) | ORAL | Status: DC
  Administered 2018-08-02 – 2018-08-03 (×4): 1000 mg via ORAL
  Filled 2018-08-02 (×4): qty 2

## 2018-08-02 MED ORDER — ONDANSETRON HCL 4 MG/2ML IJ SOLN
INTRAMUSCULAR | Status: AC
  Filled 2018-08-02: qty 2

## 2018-08-02 MED ORDER — PHENYLEPHRINE HCL (PRESSORS) 10 MG/ML IV SOLN
INTRAVENOUS | Status: DC | PRN
  Administered 2018-08-02: 100 ug via INTRAVENOUS

## 2018-08-02 MED ORDER — MIDAZOLAM HCL 2 MG/2ML IJ SOLN
INTRAMUSCULAR | Status: AC
  Filled 2018-08-02: qty 2

## 2018-08-02 MED ORDER — OXYCODONE HCL 5 MG PO TABS
5.0000 mg | ORAL_TABLET | ORAL | Status: DC | PRN
  Administered 2018-08-02: 5 mg via ORAL
  Filled 2018-08-02: qty 1

## 2018-08-02 MED ORDER — FENTANYL CITRATE (PF) 100 MCG/2ML IJ SOLN
INTRAMUSCULAR | Status: DC | PRN
  Administered 2018-08-02: 100 ug via INTRAVENOUS
  Administered 2018-08-02: 50 ug via INTRAVENOUS
  Administered 2018-08-02 (×2): 25 ug via INTRAVENOUS

## 2018-08-02 MED ORDER — ENOXAPARIN SODIUM 40 MG/0.4ML ~~LOC~~ SOLN
40.0000 mg | SUBCUTANEOUS | Status: DC
  Administered 2018-08-03: 40 mg via SUBCUTANEOUS
  Filled 2018-08-02: qty 0.4

## 2018-08-02 MED ORDER — INSULIN ASPART 100 UNIT/ML ~~LOC~~ SOLN
0.0000 [IU] | Freq: Three times a day (TID) | SUBCUTANEOUS | Status: DC
  Administered 2018-08-02: 2 [IU] via SUBCUTANEOUS
  Administered 2018-08-03: 09:00:00 1 [IU] via SUBCUTANEOUS
  Filled 2018-08-02: qty 1

## 2018-08-02 MED ORDER — MORPHINE SULFATE (PF) 2 MG/ML IV SOLN: 2.0000 mg | INTRAVENOUS | Status: DC | PRN

## 2018-08-02 MED ORDER — DEXAMETHASONE SODIUM PHOSPHATE 10 MG/ML IJ SOLN
INTRAMUSCULAR | Status: AC
  Filled 2018-08-02: qty 1

## 2018-08-02 MED ORDER — PIPERACILLIN-TAZOBACTAM 3.375 G IVPB
3.3750 g | Freq: Three times a day (TID) | INTRAVENOUS | Status: AC
  Administered 2018-08-02 (×2): 3.375 g via INTRAVENOUS
  Filled 2018-08-02 (×2): qty 50

## 2018-08-02 MED ORDER — ATORVASTATIN CALCIUM 20 MG PO TABS
20.0000 mg | ORAL_TABLET | Freq: Every day | ORAL | Status: DC
  Administered 2018-08-02: 22:00:00 20 mg via ORAL
  Filled 2018-08-02: qty 1

## 2018-08-02 MED ORDER — PROMETHAZINE HCL 25 MG/ML IJ SOLN: 6.2500 mg | INTRAMUSCULAR | Status: DC | PRN

## 2018-08-02 MED ORDER — FENTANYL CITRATE (PF) 100 MCG/2ML IJ SOLN: 25.0000 ug | INTRAMUSCULAR | Status: DC | PRN

## 2018-08-02 MED ORDER — OXYCODONE HCL 5 MG PO TABS: 5.0000 mg | ORAL_TABLET | Freq: Once | ORAL | Status: DC | PRN

## 2018-08-02 MED ORDER — PROPOFOL 10 MG/ML IV BOLUS
INTRAVENOUS | Status: DC | PRN
  Administered 2018-08-02: 150 mg via INTRAVENOUS

## 2018-08-02 MED ORDER — ONDANSETRON HCL 4 MG/2ML IJ SOLN
INTRAMUSCULAR | Status: DC | PRN
  Administered 2018-08-02: 4 mg via INTRAVENOUS

## 2018-08-02 MED ORDER — DEXAMETHASONE SODIUM PHOSPHATE 10 MG/ML IJ SOLN
INTRAMUSCULAR | Status: DC | PRN
  Administered 2018-08-02: 10 mg via INTRAVENOUS

## 2018-08-02 MED ORDER — LIDOCAINE HCL (CARDIAC) PF 100 MG/5ML IV SOSY
PREFILLED_SYRINGE | INTRAVENOUS | Status: DC | PRN
  Administered 2018-08-02: 100 mg via INTRAVENOUS

## 2018-08-02 SURGICAL SUPPLY — 41 items
APPLIER CLIP LOGIC TI 5 (MISCELLANEOUS) ×2 IMPLANT
BLADE SURG SZ11 CARB STEEL (BLADE) ×2 IMPLANT
CANISTER SUCT 3000ML PPV (MISCELLANEOUS) ×2 IMPLANT
CHLORAPREP W/TINT 26 (MISCELLANEOUS) ×2 IMPLANT
COVER WAND RF STERILE (DRAPES) ×2 IMPLANT
CUTTER FLEX LINEAR 45M (STAPLE) ×2 IMPLANT
DECANTER SPIKE VIAL GLASS SM (MISCELLANEOUS) IMPLANT
DERMABOND ADVANCED (GAUZE/BANDAGES/DRESSINGS) ×1
DERMABOND ADVANCED .7 DNX12 (GAUZE/BANDAGES/DRESSINGS) ×1 IMPLANT
ELECT REM PT RETURN 9FT ADLT (ELECTROSURGICAL) ×2
ELECTRODE REM PT RTRN 9FT ADLT (ELECTROSURGICAL) ×1 IMPLANT
GLOVE BIO SURGEON STRL SZ7 (GLOVE) ×2 IMPLANT
GLOVE BIOGEL PI IND STRL 7.5 (GLOVE) ×1 IMPLANT
GLOVE BIOGEL PI INDICATOR 7.5 (GLOVE) ×1
GOWN STRL REUS W/ TWL LRG LVL4 (GOWN DISPOSABLE) ×1 IMPLANT
GOWN STRL REUS W/ TWL XL LVL3 (GOWN DISPOSABLE) ×1 IMPLANT
GOWN STRL REUS W/TWL LRG LVL4 (GOWN DISPOSABLE) ×1
GOWN STRL REUS W/TWL XL LVL3 (GOWN DISPOSABLE) ×1
GRASPER SUT TROCAR 14GX15 (MISCELLANEOUS) ×2 IMPLANT
IRRIGATION STRYKERFLOW (MISCELLANEOUS) ×1 IMPLANT
IRRIGATOR STRYKERFLOW (MISCELLANEOUS) ×2
IV SOD CHL 0.9% 1000ML (IV SOLUTION) ×2 IMPLANT
KIT TURNOVER KIT A (KITS) ×2 IMPLANT
NEEDLE HYPO 22GX1.5 SAFETY (NEEDLE) ×2 IMPLANT
NEEDLE INSUFFLATION 14GA 120MM (NEEDLE) ×2 IMPLANT
NS IRRIG 1000ML POUR BTL (IV SOLUTION) ×2 IMPLANT
PACK LAP CHOLECYSTECTOMY (MISCELLANEOUS) ×2 IMPLANT
POUCH SPECIMEN RETRIEVAL 10MM (ENDOMECHANICALS) ×2 IMPLANT
RELOAD 45 VASCULAR/THIN (ENDOMECHANICALS) IMPLANT
RELOAD STAPLE TA45 3.5 REG BLU (ENDOMECHANICALS) ×2 IMPLANT
SET TUBE SMOKE EVAC HIGH FLOW (TUBING) ×2 IMPLANT
SHEARS HARMONIC ACE PLUS 36CM (ENDOMECHANICALS) ×2 IMPLANT
SLEEVE ENDOPATH XCEL 5M (ENDOMECHANICALS) ×2 IMPLANT
SOL .9 NS 3000ML IRR  AL (IV SOLUTION)
SOL .9 NS 3000ML IRR UROMATIC (IV SOLUTION) IMPLANT
SUT MNCRL AB 4-0 PS2 18 (SUTURE) ×2 IMPLANT
SUT VICRYL 0 UR6 27IN ABS (SUTURE) ×2 IMPLANT
SUT VICRYL AB 3-0 FS1 BRD 27IN (SUTURE) ×2 IMPLANT
TRAY FOLEY MTR SLVR 16FR STAT (SET/KITS/TRAYS/PACK) ×2 IMPLANT
TROCAR XCEL 12X100 BLDLESS (ENDOMECHANICALS) ×2 IMPLANT
TROCAR XCEL NON-BLD 5MMX100MML (ENDOMECHANICALS) ×4 IMPLANT

## 2018-08-02 NOTE — Progress Notes (Signed)
Ch met w/ pt in pre-op. Pt shared that he has appendicitis. Pt shared that he is not in as much pain as he was last night. Ch was present while pt was interviewed by anesthesiology nurse. Ch provided words of comfort. Ch contacted Ms. Edison Pace on behalf of pt. Ch left a voicemail to share that he was about to begin procedure and was in good spirits.    08/02/18 0900  Clinical Encounter Type  Visited With Patient;Health care provider  Visit Type Psychological support;Spiritual support;Pre-op;Social support  Spiritual Encounters  Spiritual Needs Emotional;Grief support  Stress Factors  Patient Stress Factors Health changes;Major life changes  Family Stress Factors None identified

## 2018-08-02 NOTE — Anesthesia Procedure Notes (Addendum)
Procedure Name: Intubation Date/Time: 08/02/2018 9:11 AM Performed by: Hedda Slade, CRNA Pre-anesthesia Checklist: Patient identified, Patient being monitored, Timeout performed, Emergency Drugs available and Suction available Patient Re-evaluated:Patient Re-evaluated prior to induction Oxygen Delivery Method: Circle system utilized Preoxygenation: Pre-oxygenation with 100% oxygen Induction Type: IV induction Ventilation: Mask ventilation without difficulty Laryngoscope Size: McGraph and 4 Grade View: Grade I Tube type: Oral Tube size: 7.5 mm Number of attempts: 1 Airway Equipment and Method: Stylet Placement Confirmation: ETT inserted through vocal cords under direct vision,  positive ETCO2 and breath sounds checked- equal and bilateral Secured at: 22 cm Tube secured with: Tape Dental Injury: Teeth and Oropharynx as per pre-operative assessment  Comments: mcgrath used for COVID precautions
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
## 2018-08-02 NOTE — Anesthesia Post-op Follow-up Note (Signed)
Anesthesia QCDR form completed.        

## 2018-08-02 NOTE — Discharge Instructions (Signed)
In addition to included general post-operative instructions for Laparoscopic Appendectomy,  Diet: Resume home heart healthy diet.   Activity: No heavy lifting >15 - 20 pounds (children, pets, laundry, garbage) or strenuous activity until follow-up in 2 weeks, but light activity and walking are encouraged. Do not drive or drink alcohol if taking narcotic pain medications.  Wound care: 2 days after surgery (Wednesday, 4/22), you may shower/get incision wet with soapy water and pat dry (do not rub incisions), but no baths or submerging incision underwater until follow-up.   Medications: Resume all home medications. For mild to moderate pain: acetaminophen (Tylenol) or ibuprofen/naproxen (if no kidney disease). Combining Tylenol with alcohol can substantially increase your risk of causing liver disease. Narcotic pain medications, if prescribed, can be used for severe pain, though may cause nausea, constipation, and drowsiness. Do not combine Tylenol and Percocet (or similar) within a 6 hour period as Percocet (and similar) contain(s) Tylenol. If you do not need the narcotic pain medication, you do not need to fill the prescription.  Call office [phone]) at any time if any questions, worsening pain, fevers/chills, bleeding, drainage from incision site, or other concerns.

## 2018-08-02 NOTE — Op Note (Addendum)
SURGICAL OPERATIVE REPORT  DATE OF PROCEDURE: 08/02/2018  ATTENDING Surgeon(s): Vickie Epley, MD  ANESTHESIA: GETA (General)  PRE-OPERATIVE DIAGNOSIS: Acute appendicitis with localized peritonitis (K35.30)  POST-OPERATIVE DIAGNOSIS: Acute perforated appendicitis with localized peritonitis without abscess (K35.32)  PROCEDURE(S):  1.) Laparoscopic appendectomy (cpt: 81856)  INTRAOPERATIVE FINDINGS: Severely inflamed and perforated distal third of retrocecal appendix with Right pelvic non-purulent serosanguinous ascites  INTRAVENOUS FLUIDS: 1000 mL crystalloid   ESTIMATED BLOOD LOSS: Minimal (<20 mL)  URINE OUTPUT: 300 mL   SPECIMENS: Appendix  IMPLANTS: None  DRAINS: None  COMPLICATIONS: None apparent  CONDITION AT END OF PROCEDURE: Hemodynamically stable and extubated  DISPOSITION OF PATIENT: PACU  INDICATIONS FOR PROCEDURE:  Patient is a 63 y.o. male on chronic low-dose prednisone (10 mg daily) who presented with acute onset of epigastric abdominal pain that then progressed to become greatest at the patient's Right lower quadrant. Patient reported his pain has been overall well-controlled since presentation. All risks, benefits, and alternatives to appendectomy were discussed with the patient, all of patient's questions were answered to his expressed satisfaction, and informed consent was obtained and documented.  DETAILS OF PROCEDURE: Patient was brought to the operating suite and appropriately identified. General anesthesia was administered along with confirmation of appropriate pre-operative antibiotics and a dose of decadron, and endotracheal intubation was performed by anesthetist. In supine position, operative site was prepped and draped in usual sterile fashion, and following a brief time out, initial 5 mm incision was made in a natural skin crease just below the umbilicus. Fascia was then elevated, and a Verress needle was inserted and its proper position  confirmed using saline meniscus test prior to abdominal insufflation.  Upon insufflation of the abdominal cavity with carbon dioxide to a well-tolerated pressure of 12-15 mmHg, a 5 mm peri-umbilical port followed by laparoscope were inserted and used to inspect the abdominal cavity and its contents with no injuries from insertion of the first trochar noted. Two additional trocars were inserted, a 12 mm port at the Left lower quadrant position and another 5 mm port at the suprapubic position. The table was then placed in Trendelenburg position with the Right side up, and blunt graspers were gently used to retract the bowel overlying the cecum, which required lysis of omental, visceral, and mesenteric adhesions to facilitate visualization. The cecum was then mobilized by carefully dissecting along the avascular white line of Toldt and then rotating the mobilized cecum medially. Following some additional medial mobilization, the non-inflamed base of the completely retrocecal appendix was able to be a appreciated, but remained densely adherent more distally to the patient's retroperitoneum. The appendix was then systematically dissected from the retroperitoneum in a stepwise manner, adding a fourth Left-sided 5 mm port for retraction, ultimately freeing all but the mesoappendix and a few remaining dense adhesions between the distal third of the appendix and the retroperitoneum. Some non-purulent ascites was also visualized in the Right pelvis and suctioned. In the process of mobilizing clearly inflamed appendix, the appendiceal artery was avulsed (at middle of artery, not its base), but this was promptly controlled using the tips of a blunt grasper for temporary hemostasis. 3 hemostatic clips were then applied to the clearly visualized and controlled appendiceal artery with confirmed complete hemostasis. The base of the appendix was again identified in relation to the cecum, and an endostapler loaded with a standard  blue tissue load was advanced across the base of the visceral appendix, which was compressed for several seconds, and the stapler was deployed  and removed from the abdominal cavity. The specimen was extracted from the abdominal cavity in a laparoscopic specimen bag.  Hemostasis was once more confirmed, and the intraperitoneal cavity was inspected with no additional findings. PMI laparoscopic fascial closure device was then used to re-approximate fascia at the 12 mm Left lower quadrant port site. All ports were then removed under direct visualization, and the abdominal cavity was desuflated. All port sites were irrigated/cleaned, additional local anesthetic was injected at each incision, 3-0 Vicryl was used to re-approximate dermis at 12 mm port site(s), and subcuticular 4-0 Monocryl suture staples was used to re-approximate skin. Skin was then cleaned, dried, and sterile skin glue was applied. Patient was then safely able to be awakened, extubated, and transferred to PACU for post-operative monitoring and care.   I was present for all aspects of the above procedure, and no operative complications were apparent.

## 2018-08-02 NOTE — Progress Notes (Signed)
Patient transported to the OR 

## 2018-08-02 NOTE — Transfer of Care (Signed)
Immediate Anesthesia Transfer of Care Note  Patient: [facility]  Procedure(s) Performed: APPENDECTOMY LAPAROSCOPIC (N/A )  Patient Location: PACU  Anesthesia Type:General  Level of Consciousness: sedated  Airway & Oxygen Therapy: Patient Spontanous Breathing and Patient connected to face mask oxygen  Post-op Assessment: Report given to RN and Post -op Vital signs reviewed and stable  Post vital signs: Reviewed  Last Vitals:  Vitals Value Taken Time  BP 137/75 08/02/2018 12:25 PM  Temp    Pulse 76 08/02/2018 12:27 PM  Resp 18 08/02/2018 12:27 PM  SpO2 98 % 08/02/2018 12:27 PM  Vitals shown include unvalidated device data.  Last Pain:  Vitals:   08/02/18 0807  TempSrc:   PainSc: 0-No pain      Patients Stated Pain Goal: 0 (00/51/10 2111)  Complications: No apparent anesthesia complications

## 2018-08-02 NOTE — Anesthesia Preprocedure Evaluation (Signed)
Anesthesia Evaluation  Patient identified by MRN, date of birth, ID band Patient awake    Reviewed: Allergy & Precautions, NPO status , Patient's Chart, lab work & pertinent test results  History of Anesthesia Complications Negative for: history of anesthetic complications  Airway Mallampati: II  TM Distance: >3 FB Neck ROM: Full    Dental  (+) Poor Dentition, Missing   Pulmonary neg sleep apnea, neg COPD, former smoker,    breath sounds clear to auscultation- rhonchi (-) wheezing      Cardiovascular hypertension, Pt. on medications (-) CAD, (-) Past MI, (-) Cardiac Stents and (-) CABG  Rhythm:Regular Rate:Normal - Systolic murmurs and - Diastolic murmurs    Neuro/Psych neg Seizures negative neurological ROS  negative psych ROS   GI/Hepatic negative GI ROS, Neg liver ROS,   Endo/Other  diabetes, Oral Hypoglycemic Agents  Renal/GU negative Renal ROS     Musculoskeletal negative musculoskeletal ROS (+)   Abdominal (+) - obese,   Peds  Hematology  (+) anemia ,   Anesthesia Other Findings Past Medical History: No date: Anemia No date: BPH (benign prostatic hyperplasia) No date: Diabetes mellitus type 2, uncomplicated ([facility]) No date: Diabetes mellitus without complication ([facility]) No date: Duodenal diverticulum No date: Duodenitis No date: Erectile dysfunction No date: History of colon polyps No date: History of normocytic normochromic anemia No date: Hypertension No date: Iron deficiency anemia, unspecified 2015: Prostate cancer ([place])     Comment:  prostate radiation No date: Pure hypercholesterolemia No date: Radiation proctitis   Reproductive/Obstetrics                             Anesthesia Physical Anesthesia Plan  ASA: II  Anesthesia Plan: General   Post-op Pain Management:    Induction: Intravenous  PONV Risk Score and Plan: 1 and Ondansetron and Midazolam  Airway  Management Planned: Oral ETT  Additional Equipment:   Intra-op Plan:   Post-operative Plan: Extubation in OR  Informed Consent: I have reviewed the patients History and Physical, chart, labs and discussed the procedure including the risks, benefits and alternatives for the proposed anesthesia with the patient or authorized representative who has indicated his/her understanding and acceptance.     Dental advisory given  Plan Discussed with: CRNA and Anesthesiologist  Anesthesia Plan Comments:         Anesthesia Quick Evaluation

## 2018-08-02 NOTE — Anesthesia Postprocedure Evaluation (Signed)
Anesthesia Post Note  Patient: Luis Aguilar  Procedure(s) Performed: APPENDECTOMY LAPAROSCOPIC (N/A )  Patient location during evaluation: PACU Anesthesia Type: General Level of consciousness: awake and alert and oriented Pain management: pain level controlled Vital Signs Assessment: post-procedure vital signs reviewed and stable Respiratory status: spontaneous breathing, nonlabored ventilation and respiratory function stable Cardiovascular status: blood pressure returned to baseline and stable Postop Assessment: no signs of nausea or vomiting Anesthetic complications: no     Last Vitals:  Vitals:   08/02/18 1257 08/02/18 1258  BP: (!) 156/78   Pulse: 79 78  Resp: 16 20  Temp:    SpO2: 91% 97%    Last Pain:  Vitals:   08/02/18 1250  TempSrc:   PainSc: 0-No pain                 Harbert Fitterer

## 2018-08-03 LAB — SURGICAL PATHOLOGY

## 2018-08-03 LAB — GLUCOSE, CAPILLARY: Glucose-Capillary: 126 mg/dL — ABNORMAL HIGH (ref 70–99)

## 2018-08-03 LAB — HIV ANTIBODY (ROUTINE TESTING W REFLEX): HIV Screen 4th Generation wRfx: NONREACTIVE

## 2018-08-03 MED ORDER — OXYCODONE HCL 5 MG PO TABS: 5.0000 mg | ORAL_TABLET | ORAL | 0 refills | Status: DC | PRN

## 2018-08-03 NOTE — Discharge Summary (Signed)
[facility] SURGICAL DISCHARGE SUMMARY  Patient ID: JUANA MONTINI MRN: [id number] DOB/AGE: [birth year] 63 y.o.  Admit date: 08/01/2018 Discharge date: 08/03/2018  Discharge Diagnoses Patient Active Problem List   Diagnosis Date Noted  . Acute appendicitis 08/01/2018  . Prostate cancer ([place]) 08/13/2017    Consultants None  Procedures 08/02/2018:  Laparoscopic Appendectomy  HPI: 63 y.o. male presented to [facility] ED yesterday (4/19) for abdominal pain. Patient reports the acute onset of RLQ abdominal pain yesterday morning which he described as sharp and constant in nature. The pain did not radiate anywhere. He endorsed associated fever to 101, nausea and decreased appetite with the pain. Tried motrin at home with minimal relief. No complaints of chills, CP, SOB, emesis, or bladder/bowel changes. No history of similar pain in the past. No previous abdominal surgeries. He does of prostate CA on lupron/zytiga with a history of radiation proctitis. He additionally does take prednisone 10 mg daily, which is last took on Saturday (04/18). Work up in the ED on 4/19 was concerning for leukocytosis and acute appendicitis  Hospital Course: Informed consent was obtained and documented, and patient underwent uneventful laparoscopic appendectomy (Dr Rosana Hoes, 08/02/2018).  Post-operatively, patient's pain improved/resolved and advancement of patient's diet and ambulation were well-tolerated. The remainder of patient's hospital course was essentially unremarkable, and discharge planning was initiated accordingly with patient safely able to be discharged home with appropriate discharge instructions, pain control, and outpatient follow-up after all of his questions were answered to his expressed satisfaction.   Discharge Condition: Good   Physical Examination:  Constitutional: Well appearing male, NAD Pulmonary: Normal effort, no respiratory distress Gastrointestinal: Soft, non-tender,  non-distended, no rebound/guarding Skin: Laparoscopic incisions are CDI, no erythema or drainage   Allergies as of 08/03/2018   No Known Allergies     Medication List    TAKE these medications   aspirin EC 81 MG tablet Take 81 mg by mouth daily.   atorvastatin 20 MG tablet Commonly known as:  LIPITOR Take 20 mg by mouth at bedtime.   enalapril 20 MG tablet Commonly known as:  VASOTEC Take 20 mg by mouth daily.   ferrous sulfate 325 (65 FE) MG tablet Take 325 mg by mouth 2 (two) times daily.   glimepiride 2 MG tablet Commonly known as:  AMARYL Take 2 mg by mouth daily with breakfast.   metFORMIN 500 MG 24 hr tablet Commonly known as:  GLUCOPHAGE-XR Take 500 mg by mouth 2 (two) times daily.   oxyCODONE 5 MG immediate release tablet Commonly known as:  Oxy IR/ROXICODONE Take 1 tablet (5 mg total) by mouth every 4 (four) hours as needed for severe pain.   potassium chloride SA 20 MEQ tablet Commonly known as:  K-DUR Take 20 mEq by mouth 2 (two) times daily.   predniSONE 10 MG tablet Commonly known as:  DELTASONE Take 10 mg by mouth daily with breakfast.   sildenafil 20 MG tablet Commonly known as:  REVATIO Take 20-60 mg by mouth as needed (prior to sexual intercourse).   tadalafil 10 MG tablet Commonly known as:  CIALIS Take 10 mg by mouth as directed.   tamsulosin 0.4 MG Caps capsule Commonly known as:  FLOMAX Take 0.4 mg by mouth at bedtime.   Zytiga 500 MG tablet Generic drug:  abiraterone acetate Take 1,000 mg by mouth daily.        Follow-up Information    Vickie Epley, MD. Schedule an appointment as soon as possible for a visit in  2 week(s).   Specialty:  General Surgery Why:  May be a virtual follow-up appointment Contact information: [address] [place] [address] [phone]            Time spent on discharge management including discussion of hospital course, clinical condition, outpatient instructions,  prescriptions, and follow up with the patient and members of the medical team: >30 minutes  -- Edison Simon , PA-C [facility]  08/03/2018, 8:32 AM [phone] M-F: 7am - 4pm

## 2018-08-03 NOTE — Clinical Social Work Note (Signed)
LCSW notified by pt's RN that pt had some short term disability leave papers that needed to be completed and returned to his employer today. Received forms by fax and worked with PA-C, Edison Simon, to get them completed and returned to [employer] at fax number [phone]. Jeff's phone is [phone]. Discussed with pt who was appreciative. There were no other TOC needs for pt's dc.

## 2018-08-11 DIAGNOSIS — K3532 Acute appendicitis with perforation and localized peritonitis, without abscess: Secondary | ICD-10-CM

## 2018-08-19 ENCOUNTER — Telehealth (INDEPENDENT_AMBULATORY_CARE_PROVIDER_SITE_OTHER): Payer: BLUE CROSS/BLUE SHIELD | Admitting: Surgery

## 2018-08-19 ENCOUNTER — Encounter: Payer: Self-pay | Admitting: Surgery

## 2018-08-19 ENCOUNTER — Other Ambulatory Visit: Payer: Self-pay

## 2018-08-19 DIAGNOSIS — K3580 Unspecified acute appendicitis: Secondary | ICD-10-CM

## 2018-08-19 DIAGNOSIS — Z4889 Encounter for other specified surgical aftercare: Secondary | ICD-10-CM

## 2018-08-19 NOTE — Progress Notes (Signed)
Referring provider:  Dion Body, MD [facility] [place], [place] [address]  Virtual Visit via Telemedicine Note  I connected with Luis Aguilar by telephone at his home on 08/19/18 at  9:30 AM EDT and verified that I was speaking with the correct person using their name and date of birth.   I discussed the limitations, risks, security and privacy concerns of performing an evaluation and management service by telephonic telemedicine and the availability of in person appointments. I also discussed with the patient that there may be a patient responsible charge related to this service. The patient expressed understanding and agreed to proceed.  History of Present Illness: 63 y.o. Male is being evaluated for post-op follow-up 17 Days s/p laparoscopic appendectomy Luis Aguilar, 08/02/2018) for acute perforated appendicitis. Patient reports complete resolution of peri-operative pain and has been tolerating regular diet with +flatus and normal BM's, denies N/V, fever/chills, CP, or SOB. He also says that he stayed out of work x 2 weeks, has returned to work this past Monday, and is doing well. Patient further describes incision(s) as well-approximated without any surrounding redness or any associated drainage, purulent or otherwise.  Review of Systems:  Constitutional: denies fever/chills  Respiratory: denies shortness of breath, wheezing  Cardiovascular: denies chest pain, palpitations  Gastrointestinal: abdominal pain, N/V, and bowel function as per interval history Skin: Denies any other rashes or skin discolorations except post-surgical wounds as per interval history  Imaging: No new pertinent imaging available for review   Assessment:  63 y.o. yo Male with a problem list including...  Patient Active Problem List   Diagnosis Date Noted  . Rupture of appendix   . Acute appendicitis 08/01/2018  . Prostate cancer ([place]) 08/13/2017    doing well at time of  current post-surgical encounter/evaluation 17 Days s/p laparoscopic appendectomy Luis Aguilar, 08/02/2018) for acute perforated appendicitis.  Follow-up Instructions / Plan:               - advance diet as tolerated  - patient's pathology results were discussed             - okay to submerge incisions under water (baths, swimming) prn             - gradually resume all activities without restrictions after no heavy lifting >40 lbs x 2 weeks             - apply sunblock particularly to incisions with sun exposure to reduce pigmentation of scars             - patient advised he may be contacted to schedule a subsequent appointment/exam when such routine follow-up appointments may safely be performed, though patient may certainly decline a subsequent appointment if wishes to do so             - otherwise, return to clinic as needed, instructed to call office if any questions or concerns  All of the above recommendations were discussed with the patient, and all of patient's questions were answered to his expressed satisfaction.  The patient was advised to call back or seek an in-person evaluation if the symptoms worsen or if the condition fails to improve as anticipated.  From [facility] outpatient surgery office, I provided 15 minutes of non-face-to-face time during this encounter.  -- Luis Aguilar Luis Hoes, MD, [place]: [facility] - Partnering for exceptional care. Office: [phone]

## 2018-12-10 IMAGING — CT CT ABD-PELV W/ CM
2 of 5 series · 15 of 46 positions shown, 17 images · IV contrast (iopamidol)
Comparison: None.

CLINICAL DATA: Prostate cancer with rising PSA.

EXAM:
CT ABDOMEN AND PELVIS WITH CONTRAST
TECHNIQUE: Multidetector CT imaging of the abdomen and pelvis was performed
using the standard protocol following bolus administration of
intravenous contrast.
CONTRAST:  100mL 35ZHCE-KHH IOPAMIDOL (35ZHCE-KHH) INJECTION 61%

[Series 2: abd pelvis · axial · 0.76mm/px · z∈[-1629,-1214]mm · 12 of 95 slices shown, 14 images (1 of 2)]
[im 6/95  soft-tissue]
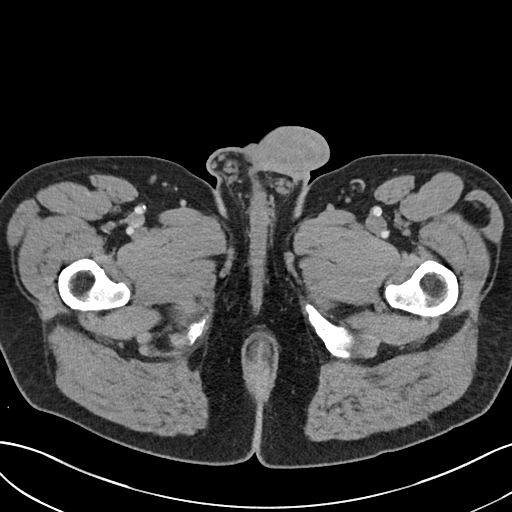
[im 6/95  bone]
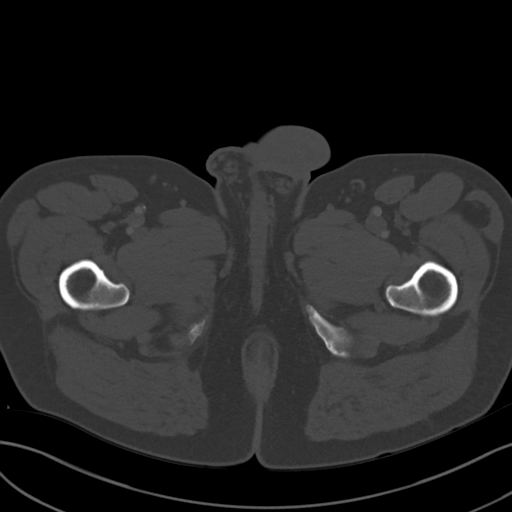
[im 17/95  soft-tissue]
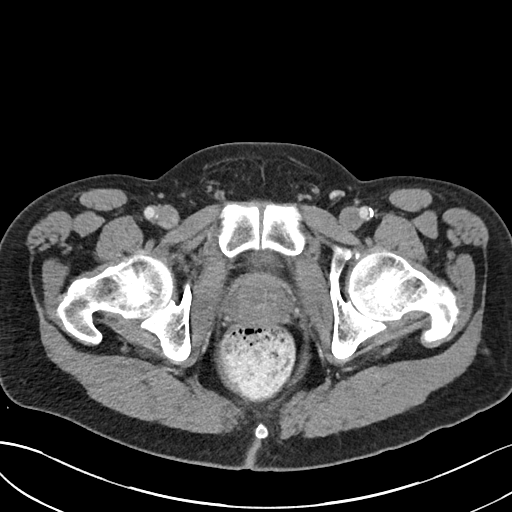
[im 23/95  soft-tissue]
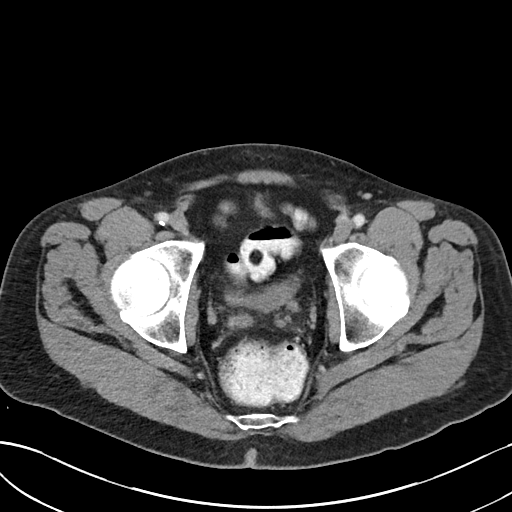
[im 28/95  soft-tissue]
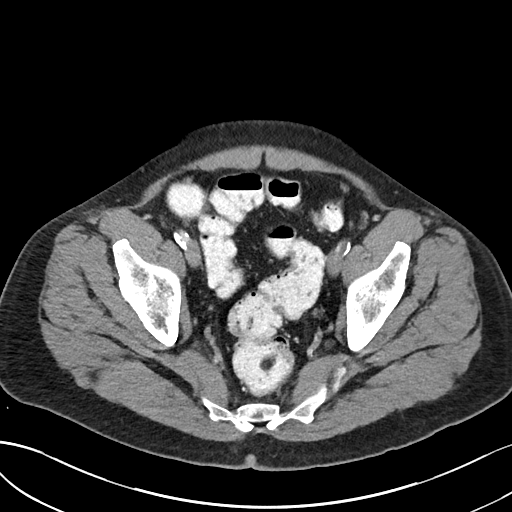
[im 39/95  soft-tissue]
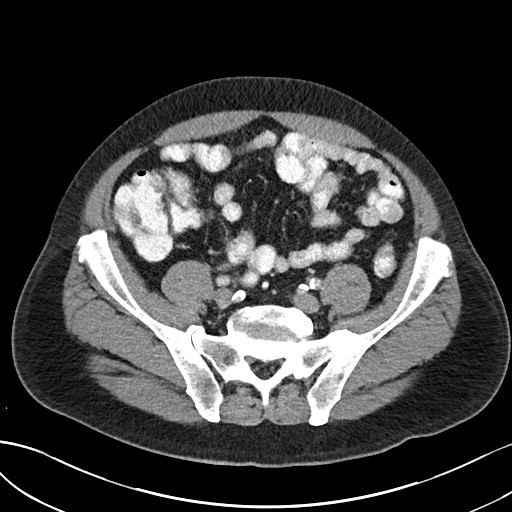
[im 45/95  soft-tissue]
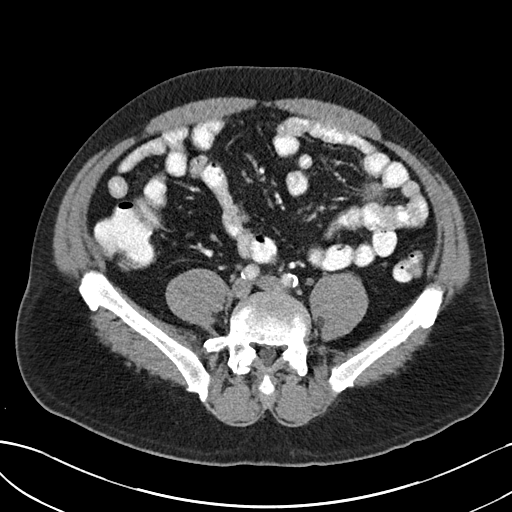
[im 50/95  soft-tissue]
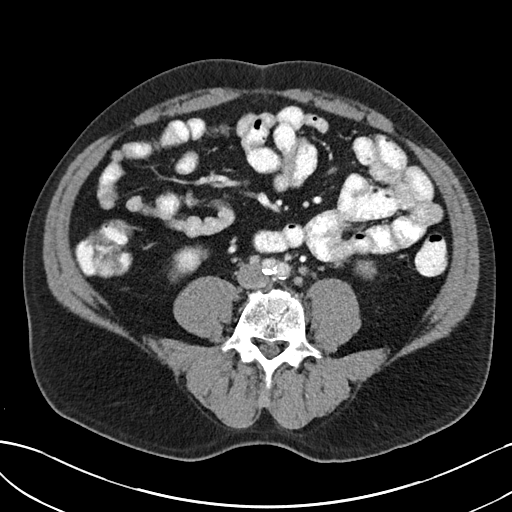
[im 61/95  soft-tissue]
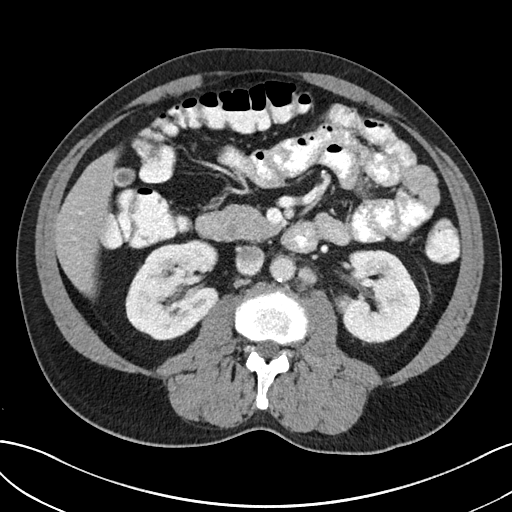
[im 67/95  soft-tissue]
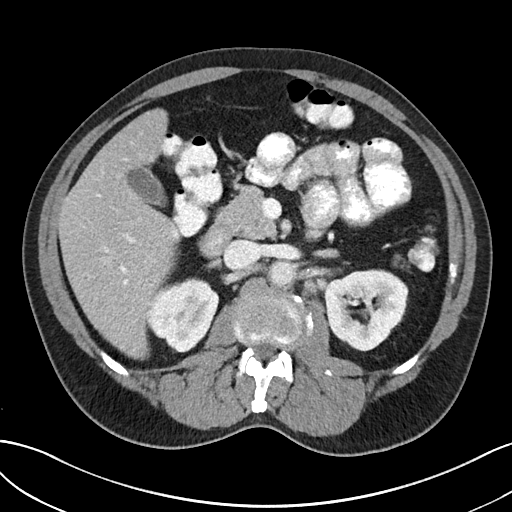
[im 67/95  bone]
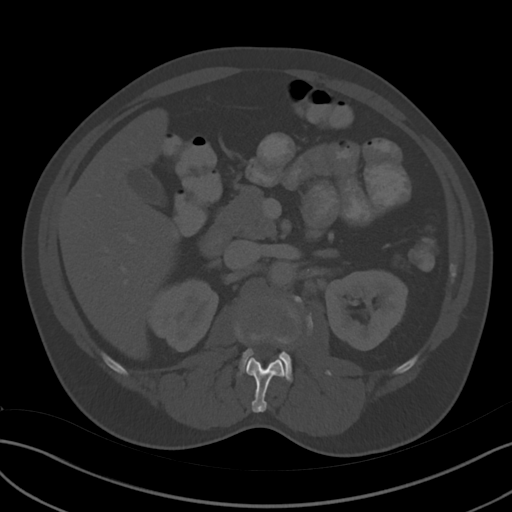
[im 72/95  soft-tissue]
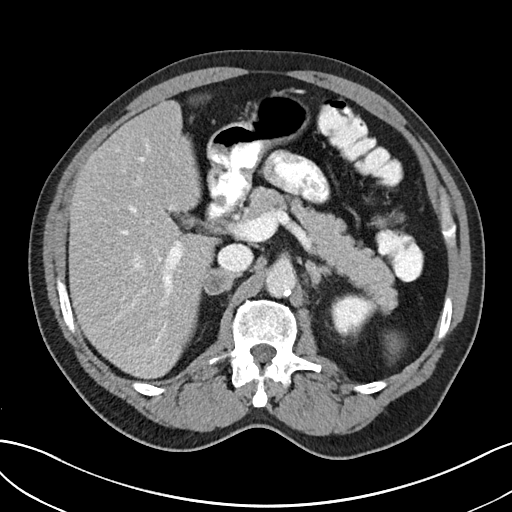
[im 83/95  soft-tissue]
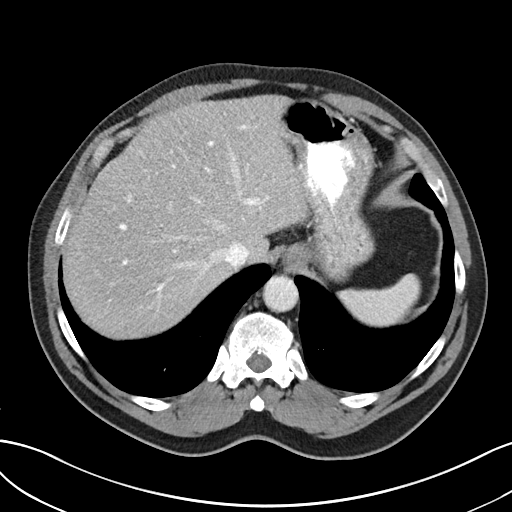
[im 89/95  soft-tissue]
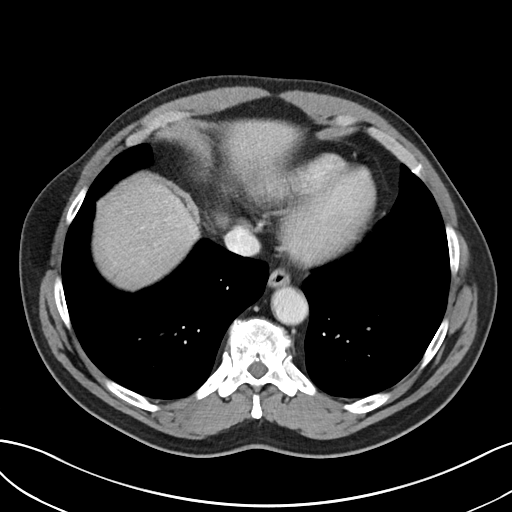

[Series 4: abd pelvis · coronal · 0.78mm/px · 3 of 162 slices shown (2 of 2)]
[im 54/162  soft-tissue]
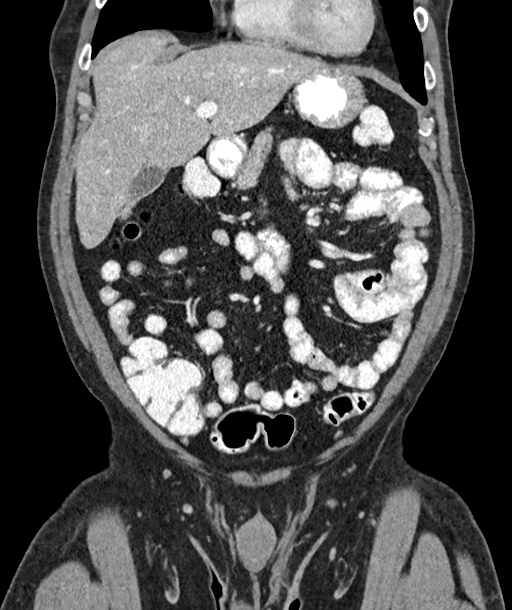
[im 72/162  soft-tissue]
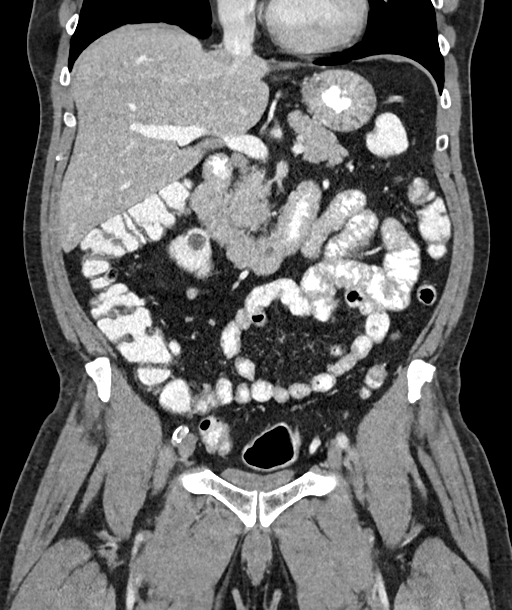
[im 90/162  soft-tissue]
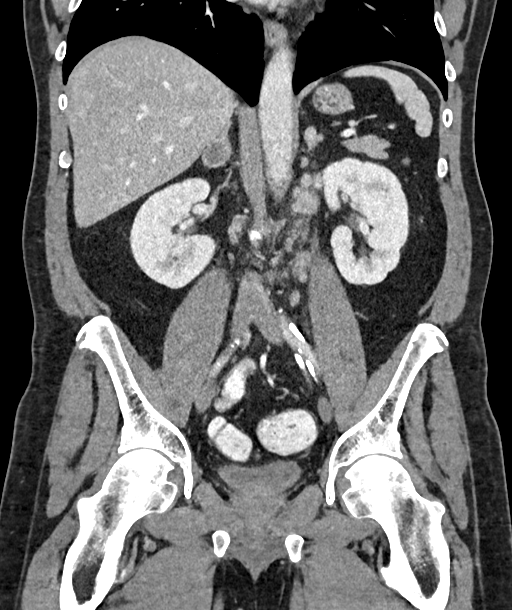

[15 of 46 positions shown; findings below may reference images not displayed]

FINDINGS: Lower chest: Unremarkable.

Hepatobiliary: No focal abnormality within the liver parenchyma.
There is no evidence for gallstones, gallbladder wall thickening, or
pericholecystic fluid. No intrahepatic or extrahepatic biliary
dilation.

Pancreas: No focal mass lesion. No dilatation of the main duct. No
intraparenchymal cyst. No peripancreatic edema.

Spleen: No splenomegaly. No focal mass lesion.

Adrenals/Urinary Tract: 2.1 cm right adrenal nodule demonstrates
relative contrast washout of 57% suggesting benign adrenal adenoma.
Left adrenal thickening evident. 2.0 cm cyst identified inferior
right kidney with another 2.7 cm exophytic lower pole right renal
cyst. Left kidney unremarkable No evidence for hydroureter. The
urinary bladder appears normal for the degree of distention.

Stomach/Bowel: Stomach is nondistended. No gastric wall thickening.
No evidence of outlet obstruction. Duodenum is normally positioned
as is the ligament of Treitz. No small bowel wall thickening. No
small bowel dilatation. The terminal ileum is normal. The appendix
is not visualized, but there is no edema or inflammation in the
region of the cecum. No gross colonic mass. No colonic wall
thickening. No substantial diverticular change.

Vascular/Lymphatic: There is abdominal aortic atherosclerosis
without aneurysm. No gastrohepatic or hepato duodenal ligament
lymphadenopathy. Para-aortic retroperitoneal lymphadenopathy is
identified. 1.5 cm short axis left para-aortic lymph node is seen on
image 31/series 2. 1.7 cm short axis left para-aortic lymph node is
visible on image 43. Multiple other small lymph nodes are seen along
the aorta in the region of the bifurcation. No pelvic sidewall
lymphadenopathy.

Reproductive: Prostate gland unremarkable.

Other: No intraperitoneal free fluid.

Musculoskeletal: Tiny sclerotic focus identified in the right iliac
bone (image 48/series 2). This is indeterminate. Similar tiny
sclerotic focus is seen in the L1 vertebral body (image 18/series 2)
IMPRESSION: 1. Retroperitoneal lymphadenopathy in the abdomen. Given the
patient's history of prostate cancer and rising PSA, primary concern
is for metastatic disease.
2. Tiny sclerotic foci in the right iliac crest and L1 vertebral
body. These are too small to characterize and attention on follow-up
recommended.
3. 2.1 cm right adrenal nodule with relative washout characteristics
most suggestive of benign adrenal adenoma.
4.  Aortic Atherosclerois ([id number]-170.0)

## 2019-03-15 ENCOUNTER — Ambulatory Visit
Admission: RE | Admit: 2019-03-15 | Discharge: 2019-03-15 | Disposition: A | Discharge status: Home / Self Care | Payer: PRIVATE HEALTH INSURANCE | Source: Ambulatory Visit | Attending: Family Medicine | Admitting: Family Medicine

## 2019-03-15 ENCOUNTER — Other Ambulatory Visit: Payer: Self-pay

## 2019-03-15 ENCOUNTER — Encounter (INDEPENDENT_AMBULATORY_CARE_PROVIDER_SITE_OTHER): Payer: Self-pay

## 2019-03-15 ENCOUNTER — Other Ambulatory Visit: Payer: Self-pay | Admitting: Family Medicine

## 2019-03-15 DIAGNOSIS — M25561 Pain in right knee: Secondary | ICD-10-CM

## 2020-01-19 ENCOUNTER — Emergency Department: Payer: PRIVATE HEALTH INSURANCE

## 2020-01-19 ENCOUNTER — Encounter: Payer: Self-pay | Admitting: Emergency Medicine

## 2020-01-19 ENCOUNTER — Other Ambulatory Visit: Payer: Self-pay

## 2020-01-19 ENCOUNTER — Emergency Department
Admission: EM | Admit: 2020-01-19 | Discharge: 2020-01-19 | Disposition: A | Discharge status: Home / Self Care | Payer: PRIVATE HEALTH INSURANCE | Attending: Emergency Medicine | Admitting: Emergency Medicine

## 2020-01-19 DIAGNOSIS — I10 Essential (primary) hypertension: Secondary | ICD-10-CM | POA: Diagnosis not present

## 2020-01-19 DIAGNOSIS — Z7984 Long term (current) use of oral hypoglycemic drugs: Secondary | ICD-10-CM | POA: Diagnosis not present

## 2020-01-19 DIAGNOSIS — R109 Unspecified abdominal pain: Secondary | ICD-10-CM

## 2020-01-19 DIAGNOSIS — E119 Type 2 diabetes mellitus without complications: Secondary | ICD-10-CM | POA: Diagnosis not present

## 2020-01-19 DIAGNOSIS — Z8546 Personal history of malignant neoplasm of prostate: Secondary | ICD-10-CM | POA: Diagnosis not present

## 2020-01-19 DIAGNOSIS — N132 Hydronephrosis with renal and ureteral calculous obstruction: Secondary | ICD-10-CM | POA: Insufficient documentation

## 2020-01-19 DIAGNOSIS — Z79899 Other long term (current) drug therapy: Secondary | ICD-10-CM | POA: Diagnosis not present

## 2020-01-19 DIAGNOSIS — Z87891 Personal history of nicotine dependence: Secondary | ICD-10-CM | POA: Diagnosis not present

## 2020-01-19 DIAGNOSIS — N200 Calculus of kidney: Secondary | ICD-10-CM

## 2020-01-19 DIAGNOSIS — R1031 Right lower quadrant pain: Secondary | ICD-10-CM | POA: Diagnosis present

## 2020-01-19 LAB — URINALYSIS, COMPLETE (UACMP) WITH MICROSCOPIC
Bacteria, UA: NONE SEEN
Bilirubin Urine: NEGATIVE
Glucose, UA: NEGATIVE mg/dL
Ketones, ur: NEGATIVE mg/dL
Leukocytes,Ua: NEGATIVE
Nitrite: NEGATIVE
Protein, ur: NEGATIVE mg/dL
Specific Gravity, Urine: 1.02 (ref 1.005–1.030)
pH: 5 (ref 5.0–8.0)

## 2020-01-19 LAB — CBC WITH DIFFERENTIAL/PLATELET
Abs Immature Granulocytes: 0.04 10*3/uL (ref 0.00–0.07)
Basophils Absolute: 0 10*3/uL (ref 0.0–0.1)
Basophils Relative: 0 %
Eosinophils Absolute: 0.2 10*3/uL (ref 0.0–0.5)
Eosinophils Relative: 3 %
HCT: 31 % — ABNORMAL LOW (ref 39.0–52.0)
Hemoglobin: 9.9 g/dL — ABNORMAL LOW (ref 13.0–17.0)
Immature Granulocytes: 0 %
Lymphocytes Relative: 12 %
Lymphs Abs: 1.1 10*3/uL (ref 0.7–4.0)
MCH: 28.9 pg (ref 26.0–34.0)
MCHC: 31.9 g/dL (ref 30.0–36.0)
MCV: 90.4 fL (ref 80.0–100.0)
Monocytes Absolute: 0.9 10*3/uL (ref 0.1–1.0)
Monocytes Relative: 10 %
Neutro Abs: 6.9 10*3/uL (ref 1.7–7.7)
Neutrophils Relative %: 75 %
Platelets: 333 10*3/uL (ref 150–400)
RBC: 3.43 MIL/uL — ABNORMAL LOW (ref 4.22–5.81)
RDW: 13.7 % (ref 11.5–15.5)
WBC: 9.2 10*3/uL (ref 4.0–10.5)
nRBC: 0 % (ref 0.0–0.2)

## 2020-01-19 LAB — COMPREHENSIVE METABOLIC PANEL
ALT: 35 U/L (ref 0–44)
AST: 32 U/L (ref 15–41)
Albumin: 3.8 g/dL (ref 3.5–5.0)
Alkaline Phosphatase: 64 U/L (ref 38–126)
Anion gap: 8 (ref 5–15)
BUN: 29 mg/dL — ABNORMAL HIGH (ref 8–23)
CO2: 24 mmol/L (ref 22–32)
Calcium: 9.4 mg/dL (ref 8.9–10.3)
Chloride: 106 mmol/L (ref 98–111)
Creatinine, Ser: 1.96 mg/dL — ABNORMAL HIGH (ref 0.61–1.24)
GFR calc non Af Amer: 35 mL/min — ABNORMAL LOW (ref >60)
Glucose, Bld: 124 mg/dL — ABNORMAL HIGH (ref 70–99)
Potassium: 4.2 mmol/L (ref 3.5–5.1)
Sodium: 138 mmol/L (ref 135–145)
Total Bilirubin: 0.6 mg/dL (ref 0.3–1.2)
Total Protein: 7.4 g/dL (ref 6.5–8.1)

## 2020-01-19 LAB — GLUCOSE, CAPILLARY: Glucose-Capillary: 96 mg/dL (ref 70–99)

## 2020-01-19 LAB — LIPASE, BLOOD: Lipase: 39 U/L (ref 11–51)

## 2020-01-19 MED ORDER — TAMSULOSIN HCL 0.4 MG PO CAPS: 0.4000 mg | ORAL_CAPSULE | Freq: Every day | ORAL | 0 refills | Status: AC

## 2020-01-19 MED ORDER — MORPHINE SULFATE (PF) 4 MG/ML IV SOLN
4.0000 mg | Freq: Once | INTRAVENOUS | Status: AC
  Administered 2020-01-19: 4 mg via INTRAVENOUS
  Filled 2020-01-19: qty 1

## 2020-01-19 MED ORDER — SODIUM CHLORIDE 0.9 % IV SOLN: INTRAVENOUS | Status: DC

## 2020-01-19 MED ORDER — IOHEXOL 300 MG/ML  SOLN
75.0000 mL | Freq: Once | INTRAMUSCULAR | Status: AC | PRN
  Administered 2020-01-19: 75 mL via INTRAVENOUS
  Filled 2020-01-19: qty 75

## 2020-01-19 MED ORDER — SODIUM CHLORIDE 0.9 % IV SOLN
1000.0000 mL | Freq: Once | INTRAVENOUS | Status: AC
  Administered 2020-01-19: 1000 mL via INTRAVENOUS

## 2020-01-19 MED ORDER — ONDANSETRON HCL 4 MG/2ML IJ SOLN
4.0000 mg | Freq: Once | INTRAMUSCULAR | Status: AC
  Administered 2020-01-19: 4 mg via INTRAVENOUS
  Filled 2020-01-19: qty 2

## 2020-01-19 MED ORDER — HYDROCODONE-ACETAMINOPHEN 5-325 MG PO TABS
2.0000 | ORAL_TABLET | Freq: Four times a day (QID) | ORAL | 0 refills | Status: DC | PRN
Start: 2020-01-19 — End: 2020-06-21
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
## 2020-01-19 NOTE — ED Triage Notes (Signed)
Patient ambulatory to triage with steady gait, without difficulty or distress noted; pt reports lower abd pain since Sunday with nausea

## 2020-01-19 NOTE — ED Provider Notes (Signed)
  Physical Exam  BP 137/90 (BP Location: Left Arm)   Pulse 75   Temp 98.5 F (36.9 C) (Oral)   Resp 18   Ht 6' (1.829 m)   Wt 94.8 kg   SpO2 98%   BMI 28.35 kg/m   Physical Exam Vitals and nursing note reviewed.  Constitutional:      Appearance: He is well-developed.  HENT:     Head: Normocephalic and atraumatic.  Eyes:     Conjunctiva/sclera: Conjunctivae normal.  Cardiovascular:     Rate and Rhythm: Normal rate and regular rhythm.     Heart sounds: No murmur heard.   Pulmonary:     Effort: Pulmonary effort is normal. No respiratory distress.     Breath sounds: Normal breath sounds.  Abdominal:     Palpations: Abdomen is soft.     Tenderness: There is abdominal tenderness in the right lower quadrant.  Musculoskeletal:     Cervical back: Neck supple.  Skin:    General: Skin is warm and dry.  Neurological:     Mental Status: He is alert.     ED Course/Procedures      Procedures  MDM  Care of this patient was signed out to me at the end of the previous provider shift.  All pertinent patient formation was conveyed and all questions answered.  Patient was pending the results of a CT of his abdomen and pelvis which showed a 3 mm right UVJ stone versus just recently passed stone on the right.  Upon reassessment patient's pain much resolved and nursing staff stated that patient did have small kidney stones in his next urine sample in the urinal.  The patient has been reexamined and is ready to be discharged.  All diagnostic results have been reviewed and discussed with the patient/family.  Care plan has been outlined and the patient/family understands all current diagnoses, results, and treatment plans.  There are no new complaints, changes, or physical findings at this time.  All questions have been addressed and answered.  All medications, if any, that were given while in the emergency department or any that are being prescribed have been reviewed with the patient/family.  All  side effects and adverse reactions have been explained.  Patient was instructed to, and agrees to follow-up with their primary care physician as well as return to the emergency department if any new or worsening symptoms develop.       Naaman Plummer, MD 01/19/20 1730

## 2020-01-19 NOTE — ED Notes (Signed)
Pt wants to know if he should take his medication that he has with him.

## 2020-01-19 NOTE — ED Notes (Signed)
CT notified that pt has IV access 

## 2020-02-29 ENCOUNTER — Emergency Department: Payer: PRIVATE HEALTH INSURANCE

## 2020-02-29 ENCOUNTER — Encounter: Payer: Self-pay | Admitting: Emergency Medicine

## 2020-02-29 ENCOUNTER — Other Ambulatory Visit: Payer: Self-pay

## 2020-02-29 ENCOUNTER — Emergency Department
Admission: EM | Admit: 2020-02-29 | Discharge: 2020-02-29 | Disposition: A | Discharge status: Home / Self Care | Payer: PRIVATE HEALTH INSURANCE | Attending: Emergency Medicine | Admitting: Emergency Medicine

## 2020-02-29 DIAGNOSIS — R1110 Vomiting, unspecified: Secondary | ICD-10-CM | POA: Insufficient documentation

## 2020-02-29 DIAGNOSIS — E119 Type 2 diabetes mellitus without complications: Secondary | ICD-10-CM | POA: Insufficient documentation

## 2020-02-29 DIAGNOSIS — R197 Diarrhea, unspecified: Secondary | ICD-10-CM | POA: Insufficient documentation

## 2020-02-29 DIAGNOSIS — I10 Essential (primary) hypertension: Secondary | ICD-10-CM | POA: Diagnosis not present

## 2020-02-29 DIAGNOSIS — R531 Weakness: Secondary | ICD-10-CM | POA: Insufficient documentation

## 2020-02-29 DIAGNOSIS — Z7982 Long term (current) use of aspirin: Secondary | ICD-10-CM | POA: Diagnosis not present

## 2020-02-29 DIAGNOSIS — Z7984 Long term (current) use of oral hypoglycemic drugs: Secondary | ICD-10-CM | POA: Diagnosis not present

## 2020-02-29 DIAGNOSIS — R109 Unspecified abdominal pain: Secondary | ICD-10-CM | POA: Diagnosis not present

## 2020-02-29 DIAGNOSIS — R000 Tachycardia, unspecified: Secondary | ICD-10-CM | POA: Insufficient documentation

## 2020-02-29 DIAGNOSIS — E860 Dehydration: Secondary | ICD-10-CM | POA: Insufficient documentation

## 2020-02-29 DIAGNOSIS — Z87891 Personal history of nicotine dependence: Secondary | ICD-10-CM | POA: Insufficient documentation

## 2020-02-29 DIAGNOSIS — R079 Chest pain, unspecified: Secondary | ICD-10-CM | POA: Diagnosis not present

## 2020-02-29 DIAGNOSIS — Z79899 Other long term (current) drug therapy: Secondary | ICD-10-CM | POA: Insufficient documentation

## 2020-02-29 LAB — URINALYSIS, COMPLETE (UACMP) WITH MICROSCOPIC
Bacteria, UA: NONE SEEN
Bilirubin Urine: NEGATIVE
Glucose, UA: NEGATIVE mg/dL
Ketones, ur: NEGATIVE mg/dL
Leukocytes,Ua: NEGATIVE
Nitrite: NEGATIVE
Protein, ur: 30 mg/dL — AB
Specific Gravity, Urine: 1.019 (ref 1.005–1.030)
pH: 5 (ref 5.0–8.0)

## 2020-02-29 LAB — BASIC METABOLIC PANEL
Anion gap: 15 (ref 5–15)
BUN: 22 mg/dL (ref 8–23)
CO2: 20 mmol/L — ABNORMAL LOW (ref 22–32)
Calcium: 8.6 mg/dL — ABNORMAL LOW (ref 8.9–10.3)
Chloride: 103 mmol/L (ref 98–111)
Creatinine, Ser: 0.78 mg/dL (ref 0.61–1.24)
GFR, Estimated: >60 mL/min (ref >60)
Glucose, Bld: 135 mg/dL — ABNORMAL HIGH (ref 70–99)
Potassium: 4.3 mmol/L (ref 3.5–5.1)
Sodium: 138 mmol/L (ref 135–145)

## 2020-02-29 LAB — CBC
HCT: 33.9 % — ABNORMAL LOW (ref 39.0–52.0)
Hemoglobin: 10.5 g/dL — ABNORMAL LOW (ref 13.0–17.0)
MCH: 27.6 pg (ref 26.0–34.0)
MCHC: 31 g/dL (ref 30.0–36.0)
MCV: 89.2 fL (ref 80.0–100.0)
Platelets: 152 10*3/uL (ref 150–400)
RBC: 3.8 MIL/uL — ABNORMAL LOW (ref 4.22–5.81)
RDW: 16.1 % — ABNORMAL HIGH (ref 11.5–15.5)
WBC: 5.1 10*3/uL (ref 4.0–10.5)
nRBC: 0 % (ref 0.0–0.2)

## 2020-02-29 LAB — HEPATIC FUNCTION PANEL
ALT: 100 U/L — ABNORMAL HIGH (ref 0–44)
AST: 126 U/L — ABNORMAL HIGH (ref 15–41)
Albumin: 3.3 g/dL — ABNORMAL LOW (ref 3.5–5.0)
Alkaline Phosphatase: 222 U/L — ABNORMAL HIGH (ref 38–126)
Bilirubin, Direct: 0.2 mg/dL (ref 0.0–0.2)
Indirect Bilirubin: 0.6 mg/dL (ref 0.3–0.9)
Total Bilirubin: 0.8 mg/dL (ref 0.3–1.2)
Total Protein: 7.1 g/dL (ref 6.5–8.1)

## 2020-02-29 LAB — TROPONIN I (HIGH SENSITIVITY): Troponin I (High Sensitivity): 17 ng/L (ref <18)

## 2020-02-29 LAB — MAGNESIUM: Magnesium: 2.3 mg/dL (ref 1.7–2.4)

## 2020-02-29 LAB — LIPASE, BLOOD: Lipase: 33 U/L (ref 11–51)

## 2020-02-29 MED ORDER — LACTATED RINGERS IV BOLUS: 1000.0000 mL | Freq: Once | INTRAVENOUS | Status: DC

## 2020-02-29 MED ORDER — IOHEXOL 300 MG/ML  SOLN
100.0000 mL | Freq: Once | INTRAMUSCULAR | Status: AC | PRN
  Administered 2020-02-29: 100 mL via INTRAVENOUS

## 2020-02-29 MED ORDER — LACTATED RINGERS IV BOLUS
1000.0000 mL | Freq: Once | INTRAVENOUS | Status: AC
  Administered 2020-02-29: 1000 mL via INTRAVENOUS

## 2020-02-29 MED ORDER — ONDANSETRON HCL 4 MG PO TABS: 4.0000 mg | ORAL_TABLET | Freq: Three times a day (TID) | ORAL | 0 refills | Status: DC | PRN

## 2020-02-29 NOTE — ED Notes (Signed)
Returned from CT, able to tolerate po intake.

## 2020-02-29 NOTE — ED Triage Notes (Signed)
Pt comes into the ED via POV c/o weakness and emesis.  Pt is a current cancer patient being treated with chemo.  He had last chemo txt last Friday and since then has been increasingly weak, NV/D, and frequent urination with little stream. Pt has even and unlabored respirations at this time. Pt states he has lower abdominal pain with the N/V/D.
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
## 2020-02-29 NOTE — ED Notes (Signed)
To CT

## 2020-06-21 ENCOUNTER — Encounter: Payer: Self-pay | Admitting: Emergency Medicine

## 2020-06-21 ENCOUNTER — Emergency Department: Payer: No Typology Code available for payment source

## 2020-06-21 ENCOUNTER — Inpatient Hospital Stay
Admission: EM | Admit: 2020-06-21 | Discharge: 2020-06-23 | DRG: 392 | Disposition: A | Discharge status: Home / Self Care | Payer: No Typology Code available for payment source | Attending: Internal Medicine | Admitting: Internal Medicine

## 2020-06-21 ENCOUNTER — Other Ambulatory Visit: Payer: Self-pay

## 2020-06-21 DIAGNOSIS — K5900 Constipation, unspecified: Secondary | ICD-10-CM | POA: Diagnosis present

## 2020-06-21 DIAGNOSIS — K219 Gastro-esophageal reflux disease without esophagitis: Secondary | ICD-10-CM | POA: Diagnosis present

## 2020-06-21 DIAGNOSIS — E871 Hypo-osmolality and hyponatremia: Secondary | ICD-10-CM | POA: Diagnosis present

## 2020-06-21 DIAGNOSIS — Z87891 Personal history of nicotine dependence: Secondary | ICD-10-CM

## 2020-06-21 DIAGNOSIS — C61 Malignant neoplasm of prostate: Secondary | ICD-10-CM | POA: Diagnosis present

## 2020-06-21 DIAGNOSIS — E785 Hyperlipidemia, unspecified: Secondary | ICD-10-CM | POA: Diagnosis present

## 2020-06-21 DIAGNOSIS — N529 Male erectile dysfunction, unspecified: Secondary | ICD-10-CM | POA: Diagnosis present

## 2020-06-21 DIAGNOSIS — E119 Type 2 diabetes mellitus without complications: Secondary | ICD-10-CM | POA: Diagnosis present

## 2020-06-21 DIAGNOSIS — R7989 Other specified abnormal findings of blood chemistry: Secondary | ICD-10-CM | POA: Diagnosis present

## 2020-06-21 DIAGNOSIS — Z8042 Family history of malignant neoplasm of prostate: Secondary | ICD-10-CM

## 2020-06-21 DIAGNOSIS — Z9221 Personal history of antineoplastic chemotherapy: Secondary | ICD-10-CM | POA: Diagnosis not present

## 2020-06-21 DIAGNOSIS — I10 Essential (primary) hypertension: Secondary | ICD-10-CM | POA: Diagnosis present

## 2020-06-21 DIAGNOSIS — D649 Anemia, unspecified: Secondary | ICD-10-CM | POA: Diagnosis present

## 2020-06-21 DIAGNOSIS — R112 Nausea with vomiting, unspecified: Secondary | ICD-10-CM | POA: Diagnosis present

## 2020-06-21 DIAGNOSIS — Z20822 Contact with and (suspected) exposure to covid-19: Secondary | ICD-10-CM | POA: Diagnosis present

## 2020-06-21 DIAGNOSIS — Z79899 Other long term (current) drug therapy: Secondary | ICD-10-CM

## 2020-06-21 DIAGNOSIS — A419 Sepsis, unspecified organism: Secondary | ICD-10-CM | POA: Diagnosis present

## 2020-06-21 DIAGNOSIS — Z7952 Long term (current) use of systemic steroids: Secondary | ICD-10-CM

## 2020-06-21 DIAGNOSIS — E861 Hypovolemia: Secondary | ICD-10-CM | POA: Diagnosis present

## 2020-06-21 DIAGNOSIS — R1032 Left lower quadrant pain: Secondary | ICD-10-CM | POA: Diagnosis not present

## 2020-06-21 DIAGNOSIS — K2980 Duodenitis without bleeding: Secondary | ICD-10-CM | POA: Diagnosis present

## 2020-06-21 DIAGNOSIS — C7951 Secondary malignant neoplasm of bone: Secondary | ICD-10-CM | POA: Diagnosis present

## 2020-06-21 DIAGNOSIS — A09 Infectious gastroenteritis and colitis, unspecified: Secondary | ICD-10-CM

## 2020-06-21 DIAGNOSIS — Z7982 Long term (current) use of aspirin: Secondary | ICD-10-CM

## 2020-06-21 DIAGNOSIS — Z7984 Long term (current) use of oral hypoglycemic drugs: Secondary | ICD-10-CM

## 2020-06-21 DIAGNOSIS — N400 Enlarged prostate without lower urinary tract symptoms: Secondary | ICD-10-CM | POA: Diagnosis present

## 2020-06-21 DIAGNOSIS — R6510 Systemic inflammatory response syndrome (SIRS) of non-infectious origin without acute organ dysfunction: Secondary | ICD-10-CM

## 2020-06-21 DIAGNOSIS — D72829 Elevated white blood cell count, unspecified: Secondary | ICD-10-CM | POA: Diagnosis present

## 2020-06-21 LAB — CBC
HCT: 32.4 % — ABNORMAL LOW (ref 39.0–52.0)
Hemoglobin: 10.5 g/dL — ABNORMAL LOW (ref 13.0–17.0)
MCH: 29.7 pg (ref 26.0–34.0)
MCHC: 32.4 g/dL (ref 30.0–36.0)
MCV: 91.8 fL (ref 80.0–100.0)
Platelets: 257 10*3/uL (ref 150–400)
RBC: 3.53 MIL/uL — ABNORMAL LOW (ref 4.22–5.81)
RDW: 15.3 % (ref 11.5–15.5)
WBC: 24 10*3/uL — ABNORMAL HIGH (ref 4.0–10.5)
nRBC: 0 % (ref 0.0–0.2)

## 2020-06-21 LAB — COMPREHENSIVE METABOLIC PANEL
ALT: 34 U/L (ref 0–44)
AST: 27 U/L (ref 15–41)
Albumin: 4.1 g/dL (ref 3.5–5.0)
Alkaline Phosphatase: 104 U/L (ref 38–126)
Anion gap: 11 (ref 5–15)
BUN: 26 mg/dL — ABNORMAL HIGH (ref 8–23)
CO2: 22 mmol/L (ref 22–32)
Calcium: 9.4 mg/dL (ref 8.9–10.3)
Chloride: 101 mmol/L (ref 98–111)
Creatinine, Ser: 1.13 mg/dL (ref 0.61–1.24)
GFR, Estimated: >60 mL/min (ref >60)
Glucose, Bld: 173 mg/dL — ABNORMAL HIGH (ref 70–99)
Potassium: 4.4 mmol/L (ref 3.5–5.1)
Sodium: 134 mmol/L — ABNORMAL LOW (ref 135–145)
Total Bilirubin: 0.4 mg/dL (ref 0.3–1.2)
Total Protein: 7.3 g/dL (ref 6.5–8.1)

## 2020-06-21 LAB — URINALYSIS, COMPLETE (UACMP) WITH MICROSCOPIC
Bacteria, UA: NONE SEEN
Bilirubin Urine: NEGATIVE
Glucose, UA: NEGATIVE mg/dL
Hgb urine dipstick: NEGATIVE
Ketones, ur: NEGATIVE mg/dL
Leukocytes,Ua: NEGATIVE
Nitrite: NEGATIVE
Protein, ur: NEGATIVE mg/dL
Specific Gravity, Urine: 1.015 (ref 1.005–1.030)
pH: 5 (ref 5.0–8.0)

## 2020-06-21 LAB — LACTIC ACID, PLASMA
Lactic Acid, Venous: 4.3 mmol/L (ref 0.5–1.9)
Lactic Acid, Venous: 3.5 mmol/L (ref 0.5–1.9)

## 2020-06-21 LAB — LIPASE, BLOOD: Lipase: 52 U/L — ABNORMAL HIGH (ref 11–51)

## 2020-06-21 MED ORDER — TRAZODONE HCL 50 MG PO TABS: 25.0000 mg | ORAL_TABLET | Freq: Every evening | ORAL | Status: DC | PRN

## 2020-06-21 MED ORDER — SODIUM CHLORIDE 0.9 % IV SOLN: INTRAVENOUS | Status: DC

## 2020-06-21 MED ORDER — ENALAPRIL MALEATE 10 MG PO TABS
20.0000 mg | ORAL_TABLET | Freq: Every day | ORAL | Status: DC
  Filled 2020-06-21: qty 2

## 2020-06-21 MED ORDER — VANCOMYCIN HCL IN DEXTROSE 1-5 GM/200ML-% IV SOLN
1000.0000 mg | Freq: Once | INTRAVENOUS | Status: AC
  Administered 2020-06-21: 1000 mg via INTRAVENOUS
  Filled 2020-06-21: qty 200

## 2020-06-21 MED ORDER — LACTATED RINGERS IV BOLUS (SEPSIS)
1000.0000 mL | Freq: Once | INTRAVENOUS | Status: AC
  Administered 2020-06-21: 1000 mL via INTRAVENOUS

## 2020-06-21 MED ORDER — TAMSULOSIN HCL 0.4 MG PO CAPS
0.4000 mg | ORAL_CAPSULE | Freq: Every day | ORAL | Status: DC
  Administered 2020-06-22 (×2): 0.4 mg via ORAL
  Filled 2020-06-21 (×2): qty 1

## 2020-06-21 MED ORDER — ONDANSETRON HCL 4 MG/2ML IJ SOLN: 4.0000 mg | Freq: Four times a day (QID) | INTRAMUSCULAR | Status: DC | PRN

## 2020-06-21 MED ORDER — PANTOPRAZOLE SODIUM 40 MG PO TBEC
40.0000 mg | DELAYED_RELEASE_TABLET | Freq: Every day | ORAL | Status: DC
  Administered 2020-06-22 – 2020-06-23 (×2): 40 mg via ORAL
  Filled 2020-06-21 (×2): qty 1

## 2020-06-21 MED ORDER — METRONIDAZOLE IN NACL 5-0.79 MG/ML-% IV SOLN
500.0000 mg | Freq: Three times a day (TID) | INTRAVENOUS | Status: DC
  Administered 2020-06-22 – 2020-06-23 (×5): 500 mg via INTRAVENOUS
  Filled 2020-06-21 (×8): qty 100

## 2020-06-21 MED ORDER — DEXAMETHASONE 4 MG PO TABS
8.0000 mg | ORAL_TABLET | ORAL | Status: DC | PRN
  Filled 2020-06-21: qty 2

## 2020-06-21 MED ORDER — ONDANSETRON HCL 4 MG PO TABS: 8.0000 mg | ORAL_TABLET | Freq: Three times a day (TID) | ORAL | Status: DC | PRN

## 2020-06-21 MED ORDER — ATORVASTATIN CALCIUM 20 MG PO TABS
20.0000 mg | ORAL_TABLET | Freq: Every day | ORAL | Status: DC
  Administered 2020-06-22: 20 mg via ORAL
  Filled 2020-06-21: qty 1

## 2020-06-21 MED ORDER — HYDROCODONE-ACETAMINOPHEN 5-325 MG PO TABS: 2.0000 | ORAL_TABLET | Freq: Four times a day (QID) | ORAL | Status: DC | PRN

## 2020-06-21 MED ORDER — INSULIN ASPART 100 UNIT/ML ~~LOC~~ SOLN
0.0000 [IU] | Freq: Three times a day (TID) | SUBCUTANEOUS | Status: DC
  Administered 2020-06-22 (×2): 1 [IU] via SUBCUTANEOUS
  Administered 2020-06-22: 2 [IU] via SUBCUTANEOUS
  Filled 2020-06-21 (×3): qty 1

## 2020-06-21 MED ORDER — PROCHLORPERAZINE MALEATE 10 MG PO TABS
10.0000 mg | ORAL_TABLET | Freq: Four times a day (QID) | ORAL | Status: DC | PRN
  Filled 2020-06-21: qty 1

## 2020-06-21 MED ORDER — SODIUM CHLORIDE 0.9 % IV SOLN
2.0000 g | Freq: Once | INTRAVENOUS | Status: AC
  Administered 2020-06-21: 2 g via INTRAVENOUS
  Filled 2020-06-21: qty 2

## 2020-06-21 MED ORDER — ONDANSETRON HCL 4 MG PO TABS: 4.0000 mg | ORAL_TABLET | Freq: Three times a day (TID) | ORAL | Status: DC | PRN

## 2020-06-21 MED ORDER — GLIMEPIRIDE 2 MG PO TABS
2.0000 mg | ORAL_TABLET | Freq: Every day | ORAL | Status: DC
  Administered 2020-06-22: 2 mg via ORAL
  Filled 2020-06-21 (×4): qty 1

## 2020-06-21 MED ORDER — FERROUS SULFATE 325 (65 FE) MG PO TABS
325.0000 mg | ORAL_TABLET | Freq: Two times a day (BID) | ORAL | Status: DC
  Administered 2020-06-22 – 2020-06-23 (×4): 325 mg via ORAL
  Filled 2020-06-21 (×5): qty 1

## 2020-06-21 MED ORDER — SODIUM CHLORIDE 0.9 % IV BOLUS
500.0000 mL | Freq: Once | INTRAVENOUS | Status: AC
  Administered 2020-06-21: 500 mL via INTRAVENOUS

## 2020-06-21 MED ORDER — SODIUM CHLORIDE 0.9 % IV SOLN
2.0000 g | Freq: Two times a day (BID) | INTRAVENOUS | Status: DC
  Administered 2020-06-22 – 2020-06-23 (×3): 2 g via INTRAVENOUS
  Filled 2020-06-21 (×6): qty 2

## 2020-06-21 MED ORDER — PREDNISONE 10 MG PO TABS
10.0000 mg | ORAL_TABLET | Freq: Every day | ORAL | Status: DC
  Administered 2020-06-22 – 2020-06-23 (×2): 10 mg via ORAL
  Filled 2020-06-21 (×3): qty 1

## 2020-06-21 MED ORDER — ONDANSETRON 4 MG PO TBDP: 8.0000 mg | ORAL_TABLET | Freq: Three times a day (TID) | ORAL | Status: DC | PRN

## 2020-06-21 MED ORDER — SODIUM CHLORIDE 0.9 % IV SOLN: 2.0000 g | INTRAVENOUS | Status: DC

## 2020-06-21 MED ORDER — POTASSIUM CHLORIDE CRYS ER 20 MEQ PO TBCR
20.0000 meq | EXTENDED_RELEASE_TABLET | Freq: Two times a day (BID) | ORAL | Status: DC
  Administered 2020-06-22 – 2020-06-23 (×4): 20 meq via ORAL
  Filled 2020-06-21 (×4): qty 1

## 2020-06-21 MED ORDER — ONDANSETRON HCL 4 MG PO TABS: 4.0000 mg | ORAL_TABLET | Freq: Four times a day (QID) | ORAL | Status: DC | PRN

## 2020-06-21 MED ORDER — ABIRATERONE ACETATE 500 MG PO TABS: 1000.0000 mg | ORAL_TABLET | Freq: Every day | ORAL | Status: DC

## 2020-06-21 MED ORDER — ACETAMINOPHEN 650 MG RE SUPP: 650.0000 mg | Freq: Four times a day (QID) | RECTAL | Status: DC | PRN

## 2020-06-21 MED ORDER — METRONIDAZOLE IN NACL 5-0.79 MG/ML-% IV SOLN
500.0000 mg | Freq: Once | INTRAVENOUS | Status: AC
  Administered 2020-06-21: 500 mg via INTRAVENOUS
  Filled 2020-06-21: qty 100

## 2020-06-21 MED ORDER — ACETAMINOPHEN 325 MG PO TABS: 650.0000 mg | ORAL_TABLET | Freq: Four times a day (QID) | ORAL | Status: DC | PRN

## 2020-06-21 MED ORDER — IOHEXOL 300 MG/ML  SOLN
100.0000 mL | Freq: Once | INTRAMUSCULAR | Status: AC | PRN
  Administered 2020-06-21: 100 mL via INTRAVENOUS

## 2020-06-21 MED ORDER — IOHEXOL 9 MG/ML PO SOLN
500.0000 mL | Freq: Once | ORAL | Status: AC
  Administered 2020-06-21: 1000 mL via ORAL

## 2020-06-21 MED ORDER — FAMOTIDINE 20 MG PO TABS
20.0000 mg | ORAL_TABLET | Freq: Every day | ORAL | Status: DC
  Administered 2020-06-22 – 2020-06-23 (×2): 20 mg via ORAL
  Filled 2020-06-21 (×2): qty 1

## 2020-06-21 NOTE — ED Notes (Signed)
Patient provided with urinal to collect urine sample. Patient wife at bedside.

## 2020-06-21 NOTE — ED Notes (Signed)
Patient ambulatory to room with steady gait. Patient reports abdominal pain x 1 week, worsening today. Patient reports pain is the worst in the LLQ. Patient reports one episode of vomiting this morning. Patient also reports one episode of bloody stool on Monday of this week. Patient reports he is currently undergoing chemo and radiation treatments for prostate cancer with mets to the liver. Patient reports scheduled labs/infusion next week.
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
## 2020-06-21 NOTE — ED Notes (Signed)
Blood cultures drawn from right and left AC. Cultures labeled and sent to lab.

## 2020-06-21 NOTE — ED Notes (Signed)
Family updated as to patient's status.

## 2020-06-21 NOTE — ED Notes (Signed)
Patient updated on POC. Patient made aware of lab values. Patient fluids and abx initiated.
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
## 2020-06-21 NOTE — Progress Notes (Signed)
CODE SEPSIS - PHARMACY COMMUNICATION  **Broad Spectrum Antibiotics should be administered within 1 hour of Sepsis diagnosis**  Time Code Sepsis Called/Page Received: 1847  Antibiotics Ordered: vancomycin/cefepime/metronidazole  Time of 1st antibiotic administration: 1900    Tawnya Crook ,PharmD Clinical Pharmacist  06/21/2020  7:25 PM

## 2020-06-21 NOTE — ED Triage Notes (Signed)
Pt comes into the ED via POV c/o LLQ abdominal pain that has been ongoing x 1 week.  Pt was sent over by [facility] clinic because he states he noticed bright red blood on the toilet paper when wiping.  Pt states he has had N/V/D x 1 week associated with the abdominal pain as well and he believes the blood was only related to having to "wipe hard".  Pt ambulatory to triage and in NAD with even and unlabored respiration.

## 2020-06-21 NOTE — ED Notes (Signed)
Patient and wife provided warm blankets. Patient provided with pillow. Patient drinking contrast at this time. Patient and wife aware of POC.

## 2020-06-21 NOTE — ED Notes (Signed)
Patient transported to CT 

## 2020-06-21 NOTE — Sepsis Progress Note (Signed)
Following for sepsis monitoring ?

## 2020-06-21 NOTE — ED Notes (Addendum)
Patient provided contrast from CT staff.

## 2020-06-21 NOTE — ED Notes (Signed)
Patient updated on POC. Wife at bedside. Patient awaiting MD re-eval and admission.

## 2020-06-21 NOTE — Progress Notes (Signed)
PHARMACY -  BRIEF ANTIBIOTIC NOTE   Pharmacy has received consult(s) for vancomycin and cefepime from an ED provider.  The patient's profile has been reviewed for ht/wt/allergies/indication/available labs.    One time order(s) placed for vancomycin 1000 mg + cefepime 2 g  Further antibiotics/pharmacy consults should be ordered by admitting physician if indicated.                       Thank you,  Tawnya Crook, PharmD 06/21/2020  6:46 PM

## 2020-06-22 DIAGNOSIS — R1032 Left lower quadrant pain: Principal | ICD-10-CM

## 2020-06-22 LAB — HEMOGLOBIN A1C
Hgb A1c MFr Bld: 6.7 % — ABNORMAL HIGH (ref 4.8–5.6)
Mean Plasma Glucose: 145.59 mg/dL

## 2020-06-22 LAB — HIV ANTIBODY (ROUTINE TESTING W REFLEX): HIV Screen 4th Generation wRfx: NONREACTIVE

## 2020-06-22 LAB — BASIC METABOLIC PANEL
Anion gap: 5 (ref 5–15)
BUN: 17 mg/dL (ref 8–23)
CO2: 25 mmol/L (ref 22–32)
Calcium: 8.7 mg/dL — ABNORMAL LOW (ref 8.9–10.3)
Chloride: 104 mmol/L (ref 98–111)
Creatinine, Ser: 0.97 mg/dL (ref 0.61–1.24)
GFR, Estimated: >60 mL/min (ref >60)
Glucose, Bld: 140 mg/dL — ABNORMAL HIGH (ref 70–99)
Potassium: 4 mmol/L (ref 3.5–5.1)
Sodium: 134 mmol/L — ABNORMAL LOW (ref 135–145)

## 2020-06-22 LAB — PROTIME-INR
INR: 1 (ref 0.8–1.2)
Prothrombin Time: 13.1 seconds (ref 11.4–15.2)

## 2020-06-22 LAB — RESP PANEL BY RT-PCR (FLU A&B, COVID) ARPGX2
Influenza A by PCR: NEGATIVE
Influenza B by PCR: NEGATIVE
SARS Coronavirus 2 by RT PCR: NEGATIVE

## 2020-06-22 LAB — CBC
HCT: 27.1 % — ABNORMAL LOW (ref 39.0–52.0)
Hemoglobin: 8.9 g/dL — ABNORMAL LOW (ref 13.0–17.0)
MCH: 29.6 pg (ref 26.0–34.0)
MCHC: 32.8 g/dL (ref 30.0–36.0)
MCV: 90 fL (ref 80.0–100.0)
Platelets: 188 10*3/uL (ref 150–400)
RBC: 3.01 MIL/uL — ABNORMAL LOW (ref 4.22–5.81)
RDW: 15.3 % (ref 11.5–15.5)
WBC: 21.7 10*3/uL — ABNORMAL HIGH (ref 4.0–10.5)
nRBC: 0 % (ref 0.0–0.2)

## 2020-06-22 LAB — PROCALCITONIN: Procalcitonin: 0.25 ng/mL

## 2020-06-22 LAB — CBG MONITORING, ED
Glucose-Capillary: 81 mg/dL (ref 70–99)
Glucose-Capillary: 139 mg/dL — ABNORMAL HIGH (ref 70–99)

## 2020-06-22 LAB — CORTISOL-AM, BLOOD: Cortisol - AM: 11.2 ug/dL (ref 6.7–22.6)

## 2020-06-22 LAB — GLUCOSE, CAPILLARY
Glucose-Capillary: 163 mg/dL — ABNORMAL HIGH (ref 70–99)
Glucose-Capillary: 137 mg/dL — ABNORMAL HIGH (ref 70–99)

## 2020-06-22 MED ORDER — RISAQUAD PO CAPS
1.0000 | ORAL_CAPSULE | Freq: Every day | ORAL | Status: DC
  Administered 2020-06-22 – 2020-06-23 (×2): 1 via ORAL
  Filled 2020-06-22 (×2): qty 1

## 2020-06-22 NOTE — ED Notes (Signed)
Pt resting in bed, requested urinal, alert and oriented. Call light in reach, bed locked and low.

## 2020-06-22 NOTE — Progress Notes (Signed)
Progress Note    Luis Aguilar  NTI:[id number] DOB: [birth year]  DOA: 06/21/2020 PCP: Dion Body, MD    Brief Narrative:     Medical records reviewed and are as summarized below:  Luis Aguilar is an 65 y.o. male with medical history significant for type 2 diabetes mellitus, hypertension, BPH, metastatic prostate cancer on chemotherapy, who presented to the emergency room with acute onset of left lower quadrant abdominal pain which has been going on over the last week increasing when he stands up or after food with associated nausea and occasional vomiting.  He has been having diarrhea with loose bowel movements.    Assessment/Plan:   Active Problems:   Sepsis ([place])    SIRS as manifested by significant leukocytosis, tachycardia and later tachypnea concerning for possible sepsis of questionable etiology.   -CT scan unrevealing so ? Viral etiology -We will continue IV antibiotic therapy with IV cefepime and Flagyl for possible intra-abdominal etiology given immunosuppression with current chemotherapy for his metastatic prostate cancer.. -not having diarrhea currently so will hold on stool studies -again, not sure of source but patient improved on current regimen so will continue and re-assess in the AM -? Constipation on CT scan   Hyponatremia and azotemia likely hypovolemic from nausea and vomiting. -IVF   Essential hypertension. -BP on lower end so hold lisinopril  Type 2 diabetes mellitus. -SSI -hold PO meds  BPH. -flowmax  GERD. -PPI  Chronic anemia. -iron  Dyslipidemia. -continue statin therapy.  Metastatic prostate cancer. -continue his Zytiga.  Family Communication/Anticipated D/C date and plan/Code Status   DVT prophylaxis: scd Code Status: Full Code.  Disposition Plan: Status is: Inpatient  Remains inpatient appropriate because:Inpatient level of care appropriate due to severity of illness   Dispo: The patient is from:  Home              Anticipated d/c is to: Home              Patient currently is not medically stable to d/c.   Difficult to place patient No   Medical Consultants:    None.    Subjective:   Feeling better, pain improved, no diarrhea, eating well  Objective:    Vitals:   06/22/20 0843 06/22/20 1140 06/22/20 1230 06/22/20 1330  BP: 101/78 95/61 103/69 99/71  Pulse: 86 92 81 89  Resp: 17 (!) 22 19 17   Temp:      TempSrc:      SpO2: 100% 98% 98% 100%  Weight:      Height:        Intake/Output Summary (Last 24 hours) at 06/22/2020 1521 Last data filed at 06/22/2020 1306 Gross per 24 hour  Intake 2425 ml  Output 1350 ml  Net 1075 ml   Filed Weights   06/21/20 1652  Weight: 85.7 kg    Exam:  General: Appearance:     Overweight male in no acute distress   +BS, mildly distended  Lungs:     respirations unlabored  Heart:    Normal heart rate. Normal rhythm. No murmurs, rubs, or gallops.   MS:   All extremities are intact.   Neurologic:   Awake, alert, oriented x 3. No apparent focal neurological           defect.     Data Reviewed:   I have personally reviewed following labs and imaging studies:  Labs: Labs show the following:   Basic Metabolic Panel: Recent Labs  Lab  06/21/20 1655 06/22/20 0428  NA 134* 134*  K 4.4 4.0  CL 101 104  CO2 22 25  GLUCOSE 173* 140*  BUN 26* 17  CREATININE 1.13 0.97  CALCIUM 9.4 8.7*   GFR Estimated Creatinine Clearance: 81.9 mL/min (by C-G formula based on SCr of 0.97 mg/dL). Liver Function Tests: Recent Labs  Lab 06/21/20 1655  AST 27  ALT 34  ALKPHOS 104  BILITOT 0.4  PROT 7.3  ALBUMIN 4.1   Recent Labs  Lab 06/21/20 1655  LIPASE 52*   No results for input(s): AMMONIA in the last 168 hours. Coagulation profile Recent Labs  Lab 06/22/20 0428  INR 1.0    CBC: Recent Labs  Lab 06/21/20 1655 06/22/20 0428  WBC 24.0* 21.7*  HGB 10.5* 8.9*  HCT 32.4* 27.1*  MCV 91.8 90.0  PLT 257 188    Cardiac Enzymes: No results for input(s): CKTOTAL, CKMB, CKMBINDEX, TROPONINI in the last 168 hours. BNP (last 3 results) No results for input(s): PROBNP in the last 8760 hours. CBG: Recent Labs  Lab 06/22/20 0910 06/22/20 1144  GLUCAP 81 139*   D-Dimer: No results for input(s): DDIMER in the last 72 hours. Hgb A1c: Recent Labs    06/21/20 2329  HGBA1C 6.7*   Lipid Profile: No results for input(s): CHOL, HDL, LDLCALC, TRIG, CHOLHDL, LDLDIRECT in the last 72 hours. Thyroid function studies: No results for input(s): TSH, T4TOTAL, T3FREE, THYROIDAB in the last 72 hours.  Invalid input(s): FREET3 Anemia work up: No results for input(s): VITAMINB12, FOLATE, FERRITIN, TIBC, IRON, RETICCTPCT in the last 72 hours. Sepsis Labs: Recent Labs  Lab 06/21/20 1655 06/21/20 1816 06/21/20 2015 06/22/20 0428  PROCALCITON  --   --   --  0.25  WBC 24.0*  --   --  21.7*  LATICACIDVEN  --  4.3* 3.5*  --     Microbiology Recent Results (from the past 240 hour(s))  Culture, blood (routine x 2)     Status: None (Preliminary result)   Collection Time: 06/21/20  7:09 PM   Specimen: BLOOD  Result Value Ref Range Status   Specimen Description BLOOD RIGHT ANTECUBITAL  Final   Special Requests   Final    BOTTLES DRAWN AEROBIC AND ANAEROBIC Blood Culture results may not be optimal due to an inadequate volume of blood received in culture bottles   Culture   Final    NO GROWTH < 12 HOURS Performed at [facility], [address], [place], [place] [address]    Report Status PENDING  Incomplete  Culture, blood (routine x 2)     Status: None (Preliminary result)   Collection Time: 06/21/20  7:09 PM   Specimen: BLOOD  Result Value Ref Range Status   Specimen Description BLOOD LEFT ANTECUBITAL  Final   Special Requests   Final    BOTTLES DRAWN AEROBIC AND ANAEROBIC Blood Culture adequate volume   Culture   Final    NO GROWTH < 12 HOURS Performed at [facility], [address] [place], [place] [address]    Report Status PENDING  Incomplete  Resp Panel by RT-PCR (Flu A&B, Covid) Nasopharyngeal Swab     Status: None   Collection Time: 06/21/20 11:29 PM   Specimen: Nasopharyngeal Swab; Nasopharyngeal(NP) swabs in vial transport medium  Result Value Ref Range Status   SARS Coronavirus 2 by RT PCR NEGATIVE NEGATIVE Final    Comment: (NOTE) SARS-CoV-2 target nucleic acids are NOT DETECTED.  The SARS-CoV-2 RNA  is generally detectable in upper respiratory specimens during the acute phase of infection. The lowest concentration of SARS-CoV-2 viral copies this assay can detect is 138 copies/mL. A negative result does not preclude SARS-Cov-2 infection and should not be used as the sole basis for treatment or other patient management decisions. A negative result may occur with  improper specimen collection/handling, submission of specimen other than nasopharyngeal swab, presence of viral mutation(s) within the areas targeted by this assay, and inadequate number of viral copies(<138 copies/mL). A negative result must be combined with clinical observations, patient history, and epidemiological information. The expected result is Negative.  Fact Sheet for Patients:  EntrepreneurPulse.com.au  Fact Sheet for Healthcare Providers:  IncredibleEmployment.be  This test is no t yet approved or cleared by the [place] FDA and  has been authorized for detection and/or diagnosis of SARS-CoV-2 by FDA under an Emergency Use Authorization (EUA). This EUA will remain  in effect (meaning this test can be used) for the duration of the COVID-19 declaration under Section 564(b)(1) of the Act, 21 U.S.C.section 360bbb-3(b)(1), unless the authorization is terminated  or revoked sooner.       Influenza A by PCR NEGATIVE NEGATIVE Final   Influenza B by PCR NEGATIVE NEGATIVE Final    Comment: (NOTE) The Xpert Xpress SARS-CoV-2/FLU/RSV  plus assay is intended as an aid in the diagnosis of influenza from Nasopharyngeal swab specimens and should not be used as a sole basis for treatment. Nasal washings and aspirates are unacceptable for Xpert Xpress SARS-CoV-2/FLU/RSV testing.  Fact Sheet for Patients: EntrepreneurPulse.com.au  Fact Sheet for Healthcare Providers: IncredibleEmployment.be  This test is not yet approved or cleared by the [place] FDA and has been authorized for detection and/or diagnosis of SARS-CoV-2 by FDA under an Emergency Use Authorization (EUA). This EUA will remain in effect (meaning this test can be used) for the duration of the COVID-19 declaration under Section 564(b)(1) of the Act, 21 U.S.C. section 360bbb-3(b)(1), unless the authorization is terminated or revoked.  Performed at [facility], [address] [place], [place] [address]     Procedures and diagnostic studies:  CT ABDOMEN PELVIS W CONTRAST  Result Date: 06/21/2020 CLINICAL DATA:  Left lower quadrant abdominal pain. Diverticulitis suspected. EXAM: CT ABDOMEN AND PELVIS WITH CONTRAST TECHNIQUE: Multidetector CT imaging of the abdomen and pelvis was performed using the standard protocol following bolus administration of intravenous contrast. CONTRAST:  167mL OMNIPAQUE IOHEXOL 300 MG/ML  SOLN COMPARISON:  CT 02/29/2020 FINDINGS: Lower chest: Lung bases are clear. Heart is normal in size. No pleural fluid. Hepatobiliary: Macrolobulated hepatic contours. Areas of capsular retraction. Ill-defined areas of hypodensity that appeared geographic as well as nodular, overall similar in appearance to prior exam. Decompressed gallbladder. No calcified gallstone. Pancreas: Calcification in the pancreatic head, chronic. No ductal dilatation or inflammation. Spleen: Scattered calcified granuloma.  Normal in size. Adrenals/Urinary Tract: 2.2 cm right adrenal nodule, unchanged findings are similar to  prior. Thickening of the left adrenal gland. No hydronephrosis or perinephric edema. Homogeneous renal enhancement with symmetric excretion on delayed phase imaging. Similar bilateral renal cysts. Urinary bladder is physiologically distended without wall thickening. Stomach/Bowel: Administered enteric contrast is seen throughout the colon. There is no bowel obstruction. Moderate volume of colonic stool. No colonic wall thickening or pericolonic edema. No significant diverticular change. Appendectomy. Normal small bowel without inflammation or obstruction. Equivocal gastric wall thickening about the fundus. Vascular/Lymphatic: Aorto bi-iliac atherosclerosis. No aortic aneurysm. Patent portal vein. Patent mesenteric veins. No enlarged lymph nodes  in the abdomen or pelvis. Reproductive: Prominent sized prostate gland spanning 4.7 cm with nodular mass effect on the bladder base. History of prostate cancer. Other: No free air, free fluid, or intra-abdominal fluid collection. Small fat containing umbilical hernia Musculoskeletal: Sclerotic foci throughout the pelvis, lower thoracic and lumbar spine. This includes right aspect of L3 vertebral body extending into the pedicle. Findings suspicious for osseous metastatic disease. Similar distribution to prior exam. IMPRESSION: 1. No explanation for left lower quadrant pain. No diverticulitis or significant diverticular disease. 2. Equivocal gastric wall thickening about the fundus, can be seen with gastritis. No other acute abnormality in the abdomen/pelvis. 3. Moderate volume of colonic stool, can be seen with constipation. 4. Macrolobulated hepatic contours with geographic and nodular areas of low-density, possibly metastatic disease. Similar appearance to November 2021 exam. Right adrenal nodule suspicious for metastasis, also unchanged. 5. Sclerotic osseous metastatic disease appears similar. Aortic Atherosclerosis (ICD10-I70.0). Electronically Signed   By: Keith Rake M.D.   On: 06/21/2020 21:14   DG Chest Portable 1 View  Result Date: 06/21/2020 CLINICAL DATA:  Sepsis workup, LEFT lower quadrant abdominal pain. EXAM: PORTABLE CHEST 1 VIEW COMPARISON:  February 29, 2020 FINDINGS: Trachea midline. Cardiomediastinal contours and hilar structures are normal. Lungs are clear. No effusion. On limited assessment no acute skeletal process. IMPRESSION: No acute cardiopulmonary disease. Electronically Signed   By: Zetta Bills M.D.   On: 06/21/2020 19:28    Medications:   . acidophilus  1 capsule Oral Daily  . famotidine  20 mg Oral Daily  . ferrous sulfate  325 mg Oral BID  . insulin aspart  0-9 Units Subcutaneous TID PC & HS  . pantoprazole  40 mg Oral Daily  . potassium chloride SA  20 mEq Oral BID  . predniSONE  10 mg Oral Q breakfast  . tamsulosin  0.4 mg Oral QHS   Continuous Infusions: . sodium chloride 100 mL/hr at 06/22/20 1142  . ceFEPime (MAXIPIME) IV Stopped (06/22/20 0927)  . metronidazole Stopped (06/22/20 1306)     LOS: 1 day   Geradine Girt  [facility]   How to contact the [facility] Attending or Consulting provider [place] or covering provider during after hours [place], for this patient?  1. Check the care team in [facility] and look for a) attending/consulting [facility] provider listed and b) the [facility] team listed 2. Log into [email or web address] and use 's universal password to access. If you do not have the password, please contact the hospital operator. 3. Locate the [facility] provider you are looking for under [facility] and page to a number that you can be directly reached. 4. If you still have difficulty reaching the provider, please page the [facility] (Director on Call) for the Hospitalists listed on amion for assistance.  06/22/2020, 3:21 PM

## 2020-06-22 NOTE — ED Notes (Signed)
Patient resting comfortably

## 2020-06-22 NOTE — ED Notes (Signed)
Dr. Vann at bedside 

## 2020-06-23 DIAGNOSIS — R6510 Systemic inflammatory response syndrome (SIRS) of non-infectious origin without acute organ dysfunction: Secondary | ICD-10-CM

## 2020-06-23 LAB — GLUCOSE, CAPILLARY
Glucose-Capillary: 106 mg/dL — ABNORMAL HIGH (ref 70–99)
Glucose-Capillary: 113 mg/dL — ABNORMAL HIGH (ref 70–99)

## 2020-06-23 LAB — BASIC METABOLIC PANEL
Anion gap: 7 (ref 5–15)
BUN: 15 mg/dL (ref 8–23)
CO2: 22 mmol/L (ref 22–32)
Calcium: 8.7 mg/dL — ABNORMAL LOW (ref 8.9–10.3)
Chloride: 110 mmol/L (ref 98–111)
Creatinine, Ser: 0.87 mg/dL (ref 0.61–1.24)
GFR, Estimated: >60 mL/min (ref >60)
Glucose, Bld: 95 mg/dL (ref 70–99)
Potassium: 4.1 mmol/L (ref 3.5–5.1)
Sodium: 139 mmol/L (ref 135–145)

## 2020-06-23 LAB — CBC
HCT: 27.7 % — ABNORMAL LOW (ref 39.0–52.0)
Hemoglobin: 9.1 g/dL — ABNORMAL LOW (ref 13.0–17.0)
MCH: 29.8 pg (ref 26.0–34.0)
MCHC: 32.9 g/dL (ref 30.0–36.0)
MCV: 90.8 fL (ref 80.0–100.0)
Platelets: 198 10*3/uL (ref 150–400)
RBC: 3.05 MIL/uL — ABNORMAL LOW (ref 4.22–5.81)
RDW: 15.5 % (ref 11.5–15.5)
WBC: 20 10*3/uL — ABNORMAL HIGH (ref 4.0–10.5)
nRBC: 0 % (ref 0.0–0.2)

## 2020-06-23 LAB — LACTIC ACID, PLASMA: Lactic Acid, Venous: 1.6 mmol/L (ref 0.5–1.9)

## 2020-06-23 MED ORDER — AMOXICILLIN-POT CLAVULANATE 875-125 MG PO TABS: 1.0000 | ORAL_TABLET | Freq: Two times a day (BID) | ORAL | 0 refills | Status: AC

## 2020-06-23 MED ORDER — ENALAPRIL MALEATE 20 MG PO TABS: 20.0000 mg | ORAL_TABLET | Freq: Every day | ORAL | Status: DC

## 2020-06-23 MED ORDER — RISAQUAD PO CAPS: 1.0000 | ORAL_CAPSULE | Freq: Every day | ORAL | 0 refills | Status: DC

## 2020-06-23 NOTE — Discharge Summary (Signed)
Physician Discharge Summary  VIDIT BOISSONNEAULT MWN:[id number] DOB: Jan 28, 1956 DOA: 06/21/2020  PCP: Dion Body, MD  Admit date: 06/21/2020 Discharge date: 06/23/2020  Admitted From: home Discharge disposition: home   Recommendations for Outpatient Follow-Up:   1. Cbc next week (? If WBCs up from steroids vs infection)   Discharge Diagnosis:   Active Problems:   SIRS (systemic inflammatory response syndrome) (HCC)    Discharge Condition: Improved.  Diet recommendation: .  Carbohydrate-modified.    Wound care: None.  Code status: Full.   History of Present Illness:   Luis Aguilar is a 65 y.o. African-American male with medical history significant for type 2 diabetes mellitus, hypertension, BPH, metastatic prostate cancer on chemotherapy, who presented to the emergency room with acute onset of left lower quadrant abdominal pain which has been going on over the last week increasing when he stands up or after food with associated nausea and occasional vomiting.  He has been having diarrhea with loose bowel movements and once had blood with the stools when he wiped few days ago. No melena or bright red bleeding per rectum otherwise. He has chills here but denied any fever or chills at home.  No chest pain or dyspnea.  No cough or wheezing.  Rhinorrhea or nasal congestion or sore throat or earache.  No dysuria, oliguria or hematuria or flank pain.   His last chemotherapy session was on 3/2 at [facility].  ED Course: Vital signs revealed heart rate of 121 with otherwise normal vital signs.  Labs revealed mild hyponatremia elevated left 26 with creatinine 1.13.  Lipase was 52.  Lactic acid was 4.3 and after hydration 3.5.  CBC showed leukocytosis 24 and anemia better than previous levels.  Urinalysis was unremarkable.  Blood cultures were sent   Hospital Course by Problem:   SIRS as manifested by significant leukocytosis, tachycardia and later tachypnea  concerning for possible sepsis of questionable etiology.  -CT scan unrevealing so ? Viral etiology -denies diarrhea -again, not sure of source bacterial vs viral but patient improved on current regimen so will continue course of abx -? Constipation on CT scan  Hyponatremia and azotemia likely hypovolemic from nausea and vomiting. -resolved  Essential hypertension. -BP on lower end so will decrease dose  Type 2 diabetes mellitus. -SSI -hold PO meds  BPH. -flowmax  GERD. -PPI  Chronic anemia. -iron  Dyslipidemia. -continue statin therapy.  Metastatic prostate cancer. -continue his Zytiga.    Medical Consultants:      Discharge Exam:   Vitals:   06/23/20 0552 06/23/20 0814  BP: 109/69 (!) 129/93  Pulse: 89 97  Resp: 16 15  Temp: 97.8 F (36.6 C) 98.2 F (36.8 C)  SpO2: 100% 100%   Vitals:   06/22/20 2042 06/23/20 0051 06/23/20 0552 06/23/20 0814  BP: 117/72 130/81 109/69 (!) 129/93  Pulse: 89 83 89 97  Resp: 16 16 16 15   Temp: 97.8 F (36.6 C) 97.8 F (36.6 C) 97.8 F (36.6 C) 98.2 F (36.8 C)  TempSrc: Oral Oral Oral Oral  SpO2: 99% 100% 100% 100%  Weight:      Height:        General exam: Appears calm and comfortable.   The results of significant diagnostics from this hospitalization (including imaging, microbiology, ancillary and laboratory) are listed below for reference.     Procedures and Diagnostic Studies:   CT ABDOMEN PELVIS W CONTRAST  Result Date: 06/21/2020 CLINICAL DATA:  Left lower  quadrant abdominal pain. Diverticulitis suspected. EXAM: CT ABDOMEN AND PELVIS WITH CONTRAST TECHNIQUE: Multidetector CT imaging of the abdomen and pelvis was performed using the standard protocol following bolus administration of intravenous contrast. CONTRAST:  159mL OMNIPAQUE IOHEXOL 300 MG/ML  SOLN COMPARISON:  CT 02/29/2020 FINDINGS: Lower chest: Lung bases are clear. Heart is normal in size. No pleural fluid. Hepatobiliary:  Macrolobulated hepatic contours. Areas of capsular retraction. Ill-defined areas of hypodensity that appeared geographic as well as nodular, overall similar in appearance to prior exam. Decompressed gallbladder. No calcified gallstone. Pancreas: Calcification in the pancreatic head, chronic. No ductal dilatation or inflammation. Spleen: Scattered calcified granuloma.  Normal in size. Adrenals/Urinary Tract: 2.2 cm right adrenal nodule, unchanged findings are similar to prior. Thickening of the left adrenal gland. No hydronephrosis or perinephric edema. Homogeneous renal enhancement with symmetric excretion on delayed phase imaging. Similar bilateral renal cysts. Urinary bladder is physiologically distended without wall thickening. Stomach/Bowel: Administered enteric contrast is seen throughout the colon. There is no bowel obstruction. Moderate volume of colonic stool. No colonic wall thickening or pericolonic edema. No significant diverticular change. Appendectomy. Normal small bowel without inflammation or obstruction. Equivocal gastric wall thickening about the fundus. Vascular/Lymphatic: Aorto bi-iliac atherosclerosis. No aortic aneurysm. Patent portal vein. Patent mesenteric veins. No enlarged lymph nodes in the abdomen or pelvis. Reproductive: Prominent sized prostate gland spanning 4.7 cm with nodular mass effect on the bladder base. History of prostate cancer. Other: No free air, free fluid, or intra-abdominal fluid collection. Small fat containing umbilical hernia Musculoskeletal: Sclerotic foci throughout the pelvis, lower thoracic and lumbar spine. This includes right aspect of L3 vertebral body extending into the pedicle. Findings suspicious for osseous metastatic disease. Similar distribution to prior exam. IMPRESSION: 1. No explanation for left lower quadrant pain. No diverticulitis or significant diverticular disease. 2. Equivocal gastric wall thickening about the fundus, can be seen with gastritis. No  other acute abnormality in the abdomen/pelvis. 3. Moderate volume of colonic stool, can be seen with constipation. 4. Macrolobulated hepatic contours with geographic and nodular areas of low-density, possibly metastatic disease. Similar appearance to November 2021 exam. Right adrenal nodule suspicious for metastasis, also unchanged. 5. Sclerotic osseous metastatic disease appears similar. Aortic Atherosclerosis (ICD10-I70.0). Electronically Signed   By: Keith Rake M.D.   On: 06/21/2020 21:14   DG Chest Portable 1 View  Result Date: 06/21/2020 CLINICAL DATA:  Sepsis workup, LEFT lower quadrant abdominal pain. EXAM: PORTABLE CHEST 1 VIEW COMPARISON:  February 29, 2020 FINDINGS: Trachea midline. Cardiomediastinal contours and hilar structures are normal. Lungs are clear. No effusion. On limited assessment no acute skeletal process. IMPRESSION: No acute cardiopulmonary disease. Electronically Signed   By: Zetta Bills M.D.   On: 06/21/2020 19:28     Labs:   Basic Metabolic Panel: Recent Labs  Lab 06/21/20 1655 06/22/20 0428 06/23/20 0439  NA 134* 134* 139  K 4.4 4.0 4.1  CL 101 104 110  CO2 22 25 22   GLUCOSE 173* 140* 95  BUN 26* 17 15  CREATININE 1.13 0.97 0.87  CALCIUM 9.4 8.7* 8.7*   GFR Estimated Creatinine Clearance: 91.4 mL/min (by C-G formula based on SCr of 0.87 mg/dL). Liver Function Tests: Recent Labs  Lab 06/21/20 1655  AST 27  ALT 34  ALKPHOS 104  BILITOT 0.4  PROT 7.3  ALBUMIN 4.1   Recent Labs  Lab 06/21/20 1655  LIPASE 52*   No results for input(s): AMMONIA in the last 168 hours. Coagulation profile Recent Labs  Lab 06/22/20  0428  INR 1.0    CBC: Recent Labs  Lab 06/21/20 1655 06/22/20 0428 06/23/20 0439  WBC 24.0* 21.7* 20.0*  HGB 10.5* 8.9* 9.1*  HCT 32.4* 27.1* 27.7*  MCV 91.8 90.0 90.8  PLT 257 188 198   Cardiac Enzymes: No results for input(s): CKTOTAL, CKMB, CKMBINDEX, TROPONINI in the last 168 hours. BNP: Invalid input(s):  POCBNP CBG: Recent Labs  Lab 06/22/20 0910 06/22/20 1144 06/22/20 1614 06/22/20 2049 06/23/20 0743  GLUCAP 81 139* 163* 137* 106*   D-Dimer No results for input(s): DDIMER in the last 72 hours. Hgb A1c Recent Labs    06/21/20 2329  HGBA1C 6.7*   Lipid Profile No results for input(s): CHOL, HDL, LDLCALC, TRIG, CHOLHDL, LDLDIRECT in the last 72 hours. Thyroid function studies No results for input(s): TSH, T4TOTAL, T3FREE, THYROIDAB in the last 72 hours.  Invalid input(s): FREET3 Anemia work up No results for input(s): VITAMINB12, FOLATE, FERRITIN, TIBC, IRON, RETICCTPCT in the last 72 hours. Microbiology Recent Results (from the past 240 hour(s))  Culture, blood (routine x 2)     Status: None (Preliminary result)   Collection Time: 06/21/20  7:09 PM   Specimen: BLOOD  Result Value Ref Range Status   Specimen Description BLOOD RIGHT ANTECUBITAL  Final   Special Requests   Final    BOTTLES DRAWN AEROBIC AND ANAEROBIC Blood Culture results may not be optimal due to an inadequate volume of blood received in culture bottles   Culture   Final    NO GROWTH 2 DAYS Performed at [facility], [address] [place], [place] [address]    Report Status PENDING  Incomplete  Culture, blood (routine x 2)     Status: None (Preliminary result)   Collection Time: 06/21/20  7:09 PM   Specimen: BLOOD  Result Value Ref Range Status   Specimen Description BLOOD LEFT ANTECUBITAL  Final   Special Requests   Final    BOTTLES DRAWN AEROBIC AND ANAEROBIC Blood Culture adequate volume   Culture   Final    NO GROWTH 2 DAYS Performed at [facility], [address] [place], [place] [address]    Report Status PENDING  Incomplete  Resp Panel by RT-PCR (Flu A&B, Covid) Nasopharyngeal Swab     Status: None   Collection Time: 06/21/20 11:29 PM   Specimen: Nasopharyngeal Swab; Nasopharyngeal(NP) swabs in vial transport medium  Result Value Ref Range Status   SARS Coronavirus  2 by RT PCR NEGATIVE NEGATIVE Final    Comment: (NOTE) SARS-CoV-2 target nucleic acids are NOT DETECTED.  The SARS-CoV-2 RNA is generally detectable in upper respiratory specimens during the acute phase of infection. The lowest concentration of SARS-CoV-2 viral copies this assay can detect is 138 copies/mL. A negative result does not preclude SARS-Cov-2 infection and should not be used as the sole basis for treatment or other patient management decisions. A negative result may occur with  improper specimen collection/handling, submission of specimen other than nasopharyngeal swab, presence of viral mutation(s) within the areas targeted by this assay, and inadequate number of viral copies(<138 copies/mL). A negative result must be combined with clinical observations, patient history, and epidemiological information. The expected result is Negative.  Fact Sheet for Patients:  EntrepreneurPulse.com.au  Fact Sheet for Healthcare Providers:  IncredibleEmployment.be  This test is no t yet approved or cleared by the [place] FDA and  has been authorized for detection and/or diagnosis of SARS-CoV-2 by FDA under an Emergency Use Authorization (EUA). This  EUA will remain  in effect (meaning this test can be used) for the duration of the COVID-19 declaration under Section 564(b)(1) of the Act, 21 U.S.C.section 360bbb-3(b)(1), unless the authorization is terminated  or revoked sooner.       Influenza A by PCR NEGATIVE NEGATIVE Final   Influenza B by PCR NEGATIVE NEGATIVE Final    Comment: (NOTE) The Xpert Xpress SARS-CoV-2/FLU/RSV plus assay is intended as an aid in the diagnosis of influenza from Nasopharyngeal swab specimens and should not be used as a sole basis for treatment. Nasal washings and aspirates are unacceptable for Xpert Xpress SARS-CoV-2/FLU/RSV testing.  Fact Sheet for Patients: EntrepreneurPulse.com.au  Fact  Sheet for Healthcare Providers: IncredibleEmployment.be  This test is not yet approved or cleared by the [place] FDA and has been authorized for detection and/or diagnosis of SARS-CoV-2 by FDA under an Emergency Use Authorization (EUA). This EUA will remain in effect (meaning this test can be used) for the duration of the COVID-19 declaration under Section 564(b)(1) of the Act, 21 U.S.C. section 360bbb-3(b)(1), unless the authorization is terminated or revoked.  Performed at [facility], [address], [place], [place] [address]      Discharge Instructions:   Discharge Instructions    Diet Carb Modified   Complete by: As directed    Increase activity slowly   Complete by: As directed      Allergies as of 06/23/2020   No Known Allergies     Medication List    STOP taking these medications   abiraterone acetate 500 MG tablet Commonly known as: ZYTIGA   atorvastatin 20 MG tablet Commonly known as: LIPITOR   naproxen sodium 220 MG tablet Commonly known as: ALEVE   Ziextenzo 6 MG/0.6ML injection Generic drug: pegfilgrastim-bmez     TAKE these medications   acidophilus Caps capsule Take 1 capsule by mouth daily. Start taking on: June 24, 2020   amoxicillin-clavulanate 875-125 MG tablet Commonly known as: Augmentin Take 1 tablet by mouth 2 (two) times daily for 3 days.   ascorbic acid 250 MG tablet Commonly known as: VITAMIN C Take 1 tablet by mouth daily.   aspirin EC 81 MG tablet Take 81 mg by mouth daily.   dexamethasone 4 MG tablet Commonly known as: DECADRON Take 8 mg by mouth daily.   enalapril 20 MG tablet Commonly known as: VASOTEC Take 1 tablet (20 mg total) by mouth daily. What changed: how much to take   famotidine 20 MG tablet Commonly known as: PEPCID Take 20 mg by mouth daily.   ferrous sulfate 325 (65 FE) MG tablet Take 325 mg by mouth 2 (two) times daily.   glimepiride 2 MG tablet Commonly  known as: AMARYL Take 2-4 mg by mouth 2 (two) times daily. Two tablets in the morning and 1 tablet in the evening   LUPRON IJ Inject 1 Dose as directed every 6 (six) months.   metFORMIN 500 MG 24 hr tablet Commonly known as: GLUCOPHAGE-XR Take 500 mg by mouth 2 (two) times daily.   omeprazole 20 MG capsule Commonly known as: PRILOSEC Take 20 mg by mouth daily.   ondansetron 8 MG disintegrating tablet Commonly known as: ZOFRAN-ODT Take 8 mg by mouth every 8 (eight) hours as needed.   potassium chloride SA 20 MEQ tablet Commonly known as: KLOR-CON Take 20 mEq by mouth 2 (two) times daily.   predniSONE 10 MG tablet Commonly known as: DELTASONE Take 10 mg by mouth daily with breakfast.  prochlorperazine 10 MG tablet Commonly known as: COMPAZINE Take 10 mg by mouth every 6 (six) hours as needed.   sildenafil 20 MG tablet Commonly known as: REVATIO Take 20-60 mg by mouth as needed (prior to sexual intercourse).   tamsulosin 0.4 MG Caps capsule Commonly known as: FLOMAX Take 0.4 mg by mouth at bedtime.       Follow-up Information    Dion Body, MD Follow up in 1 week(s).   Specialty: Family Medicine Why: cbc and ER/hospital follow up Contact information: [place] [address] [phone]                Time coordinating discharge: 35 min  Signed:  Geradine Girt DO  [facility] 06/23/2020, 10:14 AM

## 2020-06-23 NOTE — Progress Notes (Signed)
Received Md order to discharge patint to home reviewed home meds, prescriptions and follow up appointments with patient and patient verbalized understanding

## 2020-06-26 LAB — CULTURE, BLOOD (ROUTINE X 2)
Culture: NO GROWTH
Culture: NO GROWTH
Special Requests: ADEQUATE

## 2020-07-27 ENCOUNTER — Other Ambulatory Visit: Payer: Self-pay

## 2020-07-27 ENCOUNTER — Emergency Department
Admission: EM | Admit: 2020-07-27 | Discharge: 2020-07-27 | Disposition: A | Discharge status: Home / Self Care | Payer: No Typology Code available for payment source | Attending: Emergency Medicine | Admitting: Emergency Medicine

## 2020-07-27 ENCOUNTER — Emergency Department: Payer: No Typology Code available for payment source

## 2020-07-27 ENCOUNTER — Encounter: Payer: Self-pay | Admitting: Emergency Medicine

## 2020-07-27 DIAGNOSIS — Z8546 Personal history of malignant neoplasm of prostate: Secondary | ICD-10-CM | POA: Insufficient documentation

## 2020-07-27 DIAGNOSIS — R1012 Left upper quadrant pain: Secondary | ICD-10-CM | POA: Diagnosis not present

## 2020-07-27 DIAGNOSIS — E119 Type 2 diabetes mellitus without complications: Secondary | ICD-10-CM | POA: Diagnosis not present

## 2020-07-27 DIAGNOSIS — Z7982 Long term (current) use of aspirin: Secondary | ICD-10-CM | POA: Diagnosis not present

## 2020-07-27 DIAGNOSIS — Z7984 Long term (current) use of oral hypoglycemic drugs: Secondary | ICD-10-CM | POA: Diagnosis not present

## 2020-07-27 DIAGNOSIS — Z79899 Other long term (current) drug therapy: Secondary | ICD-10-CM | POA: Diagnosis not present

## 2020-07-27 DIAGNOSIS — I10 Essential (primary) hypertension: Secondary | ICD-10-CM | POA: Diagnosis not present

## 2020-07-27 LAB — CBC
HCT: 31.1 % — ABNORMAL LOW (ref 39.0–52.0)
Hemoglobin: 10.1 g/dL — ABNORMAL LOW (ref 13.0–17.0)
MCH: 30.2 pg (ref 26.0–34.0)
MCHC: 32.5 g/dL (ref 30.0–36.0)
MCV: 93.1 fL (ref 80.0–100.0)
Platelets: 204 10*3/uL (ref 150–400)
RBC: 3.34 MIL/uL — ABNORMAL LOW (ref 4.22–5.81)
RDW: 14.6 % (ref 11.5–15.5)
WBC: 34.2 10*3/uL — ABNORMAL HIGH (ref 4.0–10.5)
nRBC: 0 % (ref 0.0–0.2)

## 2020-07-27 LAB — COMPREHENSIVE METABOLIC PANEL
ALT: 46 U/L — ABNORMAL HIGH (ref 0–44)
AST: 36 U/L (ref 15–41)
Albumin: 4.1 g/dL (ref 3.5–5.0)
Alkaline Phosphatase: 72 U/L (ref 38–126)
Anion gap: 12 (ref 5–15)
BUN: 22 mg/dL (ref 8–23)
CO2: 22 mmol/L (ref 22–32)
Calcium: 9.4 mg/dL (ref 8.9–10.3)
Chloride: 102 mmol/L (ref 98–111)
Creatinine, Ser: 1.08 mg/dL (ref 0.61–1.24)
GFR, Estimated: >60 mL/min (ref >60)
Glucose, Bld: 81 mg/dL (ref 70–99)
Potassium: 3.8 mmol/L (ref 3.5–5.1)
Sodium: 136 mmol/L (ref 135–145)
Total Bilirubin: 0.6 mg/dL (ref 0.3–1.2)
Total Protein: 7.5 g/dL (ref 6.5–8.1)

## 2020-07-27 LAB — URINALYSIS, COMPLETE (UACMP) WITH MICROSCOPIC
Bacteria, UA: NONE SEEN
Bilirubin Urine: NEGATIVE
Glucose, UA: NEGATIVE mg/dL
Hgb urine dipstick: NEGATIVE
Ketones, ur: NEGATIVE mg/dL
Leukocytes,Ua: NEGATIVE
Nitrite: NEGATIVE
Protein, ur: NEGATIVE mg/dL
Specific Gravity, Urine: 1.02 (ref 1.005–1.030)
pH: 5 (ref 5.0–8.0)

## 2020-07-27 LAB — LACTIC ACID, PLASMA
Lactic Acid, Venous: 2.5 mmol/L (ref 0.5–1.9)
Lactic Acid, Venous: 2.2 mmol/L (ref 0.5–1.9)

## 2020-07-27 LAB — LIPASE, BLOOD: Lipase: 34 U/L (ref 11–51)

## 2020-07-27 MED ORDER — ONDANSETRON HCL 4 MG/2ML IJ SOLN
4.0000 mg | Freq: Once | INTRAMUSCULAR | Status: AC
  Administered 2020-07-27: 4 mg via INTRAVENOUS
  Filled 2020-07-27: qty 2

## 2020-07-27 MED ORDER — SODIUM CHLORIDE 0.9 % IV BOLUS
1000.0000 mL | Freq: Once | INTRAVENOUS | Status: AC
  Administered 2020-07-27: 1000 mL via INTRAVENOUS

## 2020-07-27 MED ORDER — ALUM & MAG HYDROXIDE-SIMETH 200-200-20 MG/5ML PO SUSP
30.0000 mL | Freq: Once | ORAL | Status: AC
  Administered 2020-07-27: 30 mL via ORAL
  Filled 2020-07-27: qty 30

## 2020-07-27 MED ORDER — LIDOCAINE VISCOUS HCL 2 % MT SOLN
15.0000 mL | Freq: Once | OROMUCOSAL | Status: AC
  Administered 2020-07-27: 15 mL via ORAL
  Filled 2020-07-27: qty 15

## 2020-07-27 MED ORDER — IOHEXOL 300 MG/ML  SOLN
100.0000 mL | Freq: Once | INTRAMUSCULAR | Status: AC | PRN
  Administered 2020-07-27: 100 mL via INTRAVENOUS

## 2020-07-27 MED ORDER — HYDROCODONE-ACETAMINOPHEN 5-325 MG PO TABS: 1.0000 | ORAL_TABLET | Freq: Four times a day (QID) | ORAL | 0 refills | Status: DC | PRN

## 2020-07-27 MED ORDER — HYDROCODONE-ACETAMINOPHEN 5-325 MG PO TABS
1.0000 | ORAL_TABLET | Freq: Once | ORAL | Status: AC
  Administered 2020-07-27: 1 via ORAL
  Filled 2020-07-27: qty 1

## 2020-07-27 MED ORDER — ONDANSETRON 4 MG PO TBDP: 4.0000 mg | ORAL_TABLET | Freq: Three times a day (TID) | ORAL | 0 refills | Status: DC | PRN

## 2020-07-27 MED ORDER — PANTOPRAZOLE SODIUM 40 MG PO TBEC: 40.0000 mg | DELAYED_RELEASE_TABLET | Freq: Every day | ORAL | 0 refills | Status: AC

## 2020-07-27 MED ORDER — PANTOPRAZOLE SODIUM 40 MG IV SOLR
40.0000 mg | Freq: Once | INTRAVENOUS | Status: AC
  Administered 2020-07-27: 40 mg via INTRAVENOUS
  Filled 2020-07-27: qty 40

## 2020-07-27 MED ORDER — HYDROMORPHONE HCL 1 MG/ML IJ SOLN
1.0000 mg | Freq: Once | INTRAMUSCULAR | Status: AC
  Administered 2020-07-27: 1 mg via INTRAVENOUS
  Filled 2020-07-27: qty 1

## 2020-07-27 NOTE — ED Notes (Signed)
NAD noted at time of D/C. Pt denies questions or concerns. Pt ambulatory to the lobby at this time. Verbal consent for D/C obtained at this time.  

## 2020-07-27 NOTE — ED Notes (Signed)
Pt states that he is having pain in his abdomen in the LUQ. Pt states that he had a similar pain a few weeks ago but it was in the LLQ. Pt is tender on palpation in the LUQ and RLQ.   Pt reports that he is on chemo for prostate cancer, his last treatment was on Wednesday. Pt states that he had 1 episode of vomiting while he was getting chemo treatment, states that this is not unusual for him. Pt also reports that he has been having diarrhea but that this is not unusual for him either.

## 2020-07-27 NOTE — ED Triage Notes (Signed)
Pt to ED via POV with c/o LUQ abd pain, pt states pain 8/10, denies N/V/D at this time. Pt A&O x4, ambulatory without difficulty at this time.
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
## 2020-08-01 LAB — CULTURE, BLOOD (SINGLE): Culture: NO GROWTH

## 2020-10-03 ENCOUNTER — Other Ambulatory Visit: Payer: Self-pay | Admitting: Gastroenterology

## 2020-10-03 DIAGNOSIS — R112 Nausea with vomiting, unspecified: Secondary | ICD-10-CM

## 2020-10-03 DIAGNOSIS — K7460 Unspecified cirrhosis of liver: Secondary | ICD-10-CM

## 2020-10-03 DIAGNOSIS — R1011 Right upper quadrant pain: Secondary | ICD-10-CM

## 2020-10-03 DIAGNOSIS — R140 Abdominal distension (gaseous): Secondary | ICD-10-CM

## 2020-10-18 ENCOUNTER — Emergency Department: Payer: No Typology Code available for payment source

## 2020-10-18 ENCOUNTER — Other Ambulatory Visit: Payer: Self-pay

## 2020-10-18 ENCOUNTER — Emergency Department
Admission: EM | Admit: 2020-10-18 | Discharge: 2020-10-18 | Disposition: A | Discharge status: Home / Self Care | Payer: No Typology Code available for payment source | Attending: Student in an Organized Health Care Education/Training Program | Admitting: Student in an Organized Health Care Education/Training Program

## 2020-10-18 DIAGNOSIS — R1013 Epigastric pain: Secondary | ICD-10-CM | POA: Insufficient documentation

## 2020-10-18 DIAGNOSIS — Z8546 Personal history of malignant neoplasm of prostate: Secondary | ICD-10-CM | POA: Diagnosis not present

## 2020-10-18 DIAGNOSIS — Z79899 Other long term (current) drug therapy: Secondary | ICD-10-CM | POA: Insufficient documentation

## 2020-10-18 DIAGNOSIS — Z7982 Long term (current) use of aspirin: Secondary | ICD-10-CM | POA: Insufficient documentation

## 2020-10-18 DIAGNOSIS — R112 Nausea with vomiting, unspecified: Secondary | ICD-10-CM | POA: Insufficient documentation

## 2020-10-18 DIAGNOSIS — Z7984 Long term (current) use of oral hypoglycemic drugs: Secondary | ICD-10-CM | POA: Insufficient documentation

## 2020-10-18 DIAGNOSIS — Z87891 Personal history of nicotine dependence: Secondary | ICD-10-CM | POA: Diagnosis not present

## 2020-10-18 DIAGNOSIS — E119 Type 2 diabetes mellitus without complications: Secondary | ICD-10-CM | POA: Insufficient documentation

## 2020-10-18 DIAGNOSIS — I10 Essential (primary) hypertension: Secondary | ICD-10-CM | POA: Diagnosis not present

## 2020-10-18 LAB — URINALYSIS, COMPLETE (UACMP) WITH MICROSCOPIC
Bacteria, UA: NONE SEEN
Bilirubin Urine: NEGATIVE
Glucose, UA: NEGATIVE mg/dL
Ketones, ur: NEGATIVE mg/dL
Leukocytes,Ua: NEGATIVE
Nitrite: NEGATIVE
Protein, ur: NEGATIVE mg/dL
Specific Gravity, Urine: 1.021 (ref 1.005–1.030)
pH: 5 (ref 5.0–8.0)

## 2020-10-18 LAB — CBC
HCT: 33.8 % — ABNORMAL LOW (ref 39.0–52.0)
Hemoglobin: 10.9 g/dL — ABNORMAL LOW (ref 13.0–17.0)
MCH: 30.2 pg (ref 26.0–34.0)
MCHC: 32.2 g/dL (ref 30.0–36.0)
MCV: 93.6 fL (ref 80.0–100.0)
Platelets: 215 10*3/uL (ref 150–400)
RBC: 3.61 MIL/uL — ABNORMAL LOW (ref 4.22–5.81)
RDW: 14.6 % (ref 11.5–15.5)
WBC: 12.7 10*3/uL — ABNORMAL HIGH (ref 4.0–10.5)
nRBC: 0 % (ref 0.0–0.2)

## 2020-10-18 LAB — COMPREHENSIVE METABOLIC PANEL
ALT: 43 U/L (ref 0–44)
AST: 36 U/L (ref 15–41)
Albumin: 4.3 g/dL (ref 3.5–5.0)
Alkaline Phosphatase: 79 U/L (ref 38–126)
Anion gap: 11 (ref 5–15)
BUN: 21 mg/dL (ref 8–23)
CO2: 25 mmol/L (ref 22–32)
Calcium: 9.9 mg/dL (ref 8.9–10.3)
Chloride: 100 mmol/L (ref 98–111)
Creatinine, Ser: 0.85 mg/dL (ref 0.61–1.24)
GFR, Estimated: >60 mL/min (ref >60)
Glucose, Bld: 135 mg/dL — ABNORMAL HIGH (ref 70–99)
Potassium: 4.3 mmol/L (ref 3.5–5.1)
Sodium: 136 mmol/L (ref 135–145)
Total Bilirubin: 0.7 mg/dL (ref 0.3–1.2)
Total Protein: 7.9 g/dL (ref 6.5–8.1)

## 2020-10-18 LAB — LIPASE, BLOOD: Lipase: 30 U/L (ref 11–51)

## 2020-10-18 MED ORDER — IOHEXOL 300 MG/ML  SOLN
100.0000 mL | Freq: Once | INTRAMUSCULAR | Status: AC | PRN
  Administered 2020-10-18: 100 mL via INTRAVENOUS

## 2020-10-18 MED ORDER — PANTOPRAZOLE SODIUM 40 MG PO TBEC: 40.0000 mg | DELAYED_RELEASE_TABLET | Freq: Every day | ORAL | 2 refills | Status: AC

## 2020-10-18 MED ORDER — MORPHINE SULFATE (PF) 4 MG/ML IV SOLN
4.0000 mg | INTRAVENOUS | Status: DC | PRN
  Administered 2020-10-18: 4 mg via INTRAVENOUS
  Filled 2020-10-18: qty 1

## 2020-10-18 MED ORDER — ONDANSETRON HCL 4 MG/2ML IJ SOLN
4.0000 mg | Freq: Once | INTRAMUSCULAR | Status: AC
  Administered 2020-10-18: 4 mg via INTRAVENOUS
  Filled 2020-10-18: qty 2

## 2020-10-18 NOTE — ED Notes (Signed)
Pt to ED for abdominal pain worse after eating since 3d. Pt has seen gastroenterologist and was taking sulfacrate since 6/22. Pt having NVD since 3d. With 3-4 diarrheal episodes and 1-2 emesis per day. Pt has de accessed power port R chest, implanted about 2 months ago for prostate cancer treatment.

## 2020-10-18 NOTE — Discharge Instructions (Addendum)
I am not sure why you are having the belly pain.  The CT does not show anything going on.  Your urine is clear and the liver function tests lipase to check your pancreas are normal.  Your blood pressure is very close to what has been in the office in the past.  I would like you to follow-up with your primary care doctor and your gastroenterologist.  Please return here if you get worsening pain, fever or begin vomiting and cannot keep anything down.  Also return here if you just are feeling worse.   I have given you a prescription for Protonix 1 pill a day.  Remember to take it 2 hours before or after any of the Carafate or sucralfate that you are taking.  This may help with the pain.

## 2020-10-18 NOTE — ED Triage Notes (Signed)
Pt c/o lower abd pain for the past 3 days with nausea, states he had the same sx the last week of June was seen at [facility] and rx meds that seemed to help. Pt is in NAD at present.
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
## 2020-10-18 NOTE — ED Notes (Signed)
EDP at bedside  

## 2020-10-19 ENCOUNTER — Emergency Department: Payer: Medicare (Managed Care)

## 2020-10-19 ENCOUNTER — Encounter: Payer: Self-pay | Admitting: Emergency Medicine

## 2020-10-19 ENCOUNTER — Emergency Department
Admission: EM | Admit: 2020-10-19 | Discharge: 2020-10-19 | Disposition: A | Discharge status: Home / Self Care | Payer: Medicare (Managed Care) | Attending: Emergency Medicine | Admitting: Emergency Medicine

## 2020-10-19 DIAGNOSIS — R109 Unspecified abdominal pain: Secondary | ICD-10-CM | POA: Insufficient documentation

## 2020-10-19 DIAGNOSIS — I10 Essential (primary) hypertension: Secondary | ICD-10-CM | POA: Diagnosis not present

## 2020-10-19 DIAGNOSIS — E119 Type 2 diabetes mellitus without complications: Secondary | ICD-10-CM | POA: Insufficient documentation

## 2020-10-19 DIAGNOSIS — Z7984 Long term (current) use of oral hypoglycemic drugs: Secondary | ICD-10-CM | POA: Diagnosis not present

## 2020-10-19 DIAGNOSIS — Z8546 Personal history of malignant neoplasm of prostate: Secondary | ICD-10-CM | POA: Insufficient documentation

## 2020-10-19 DIAGNOSIS — Z79899 Other long term (current) drug therapy: Secondary | ICD-10-CM | POA: Diagnosis not present

## 2020-10-19 DIAGNOSIS — Z87891 Personal history of nicotine dependence: Secondary | ICD-10-CM | POA: Diagnosis not present

## 2020-10-19 LAB — COMPREHENSIVE METABOLIC PANEL
ALT: 42 U/L (ref 0–44)
AST: 37 U/L (ref 15–41)
Albumin: 4.1 g/dL (ref 3.5–5.0)
Alkaline Phosphatase: 80 U/L (ref 38–126)
Anion gap: 10 (ref 5–15)
BUN: 27 mg/dL — ABNORMAL HIGH (ref 8–23)
CO2: 23 mmol/L (ref 22–32)
Calcium: 9.6 mg/dL (ref 8.9–10.3)
Chloride: 100 mmol/L (ref 98–111)
Creatinine, Ser: 1.1 mg/dL (ref 0.61–1.24)
GFR, Estimated: >60 mL/min (ref >60)
Glucose, Bld: 103 mg/dL — ABNORMAL HIGH (ref 70–99)
Potassium: 4.2 mmol/L (ref 3.5–5.1)
Sodium: 133 mmol/L — ABNORMAL LOW (ref 135–145)
Total Bilirubin: 0.8 mg/dL (ref 0.3–1.2)
Total Protein: 7.7 g/dL (ref 6.5–8.1)

## 2020-10-19 LAB — CBC
HCT: 35.2 % — ABNORMAL LOW (ref 39.0–52.0)
Hemoglobin: 11.3 g/dL — ABNORMAL LOW (ref 13.0–17.0)
MCH: 31.1 pg (ref 26.0–34.0)
MCHC: 32.1 g/dL (ref 30.0–36.0)
MCV: 97 fL (ref 80.0–100.0)
Platelets: 186 10*3/uL (ref 150–400)
RBC: 3.63 MIL/uL — ABNORMAL LOW (ref 4.22–5.81)
RDW: 14.4 % (ref 11.5–15.5)
WBC: 9.3 10*3/uL (ref 4.0–10.5)
nRBC: 0 % (ref 0.0–0.2)

## 2020-10-19 LAB — LIPASE, BLOOD: Lipase: 29 U/L (ref 11–51)

## 2020-10-19 MED ORDER — DICYCLOMINE HCL 10 MG PO CAPS: 10.0000 mg | ORAL_CAPSULE | Freq: Three times a day (TID) | ORAL | 0 refills | Status: DC | PRN

## 2020-10-19 MED ORDER — DICYCLOMINE HCL 10 MG PO CAPS
20.0000 mg | ORAL_CAPSULE | Freq: Once | ORAL | Status: AC
  Administered 2020-10-19: 20 mg via ORAL
  Filled 2020-10-19: qty 2
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
## 2020-10-19 NOTE — Discharge Instructions (Addendum)
Please seek medical attention for any high fevers, chest pain, shortness of breath, change in behavior, persistent vomiting, bloody stool or any other new or concerning symptoms.  

## 2020-10-19 NOTE — ED Triage Notes (Signed)
C/O lower abdominal pain x 5 days.  Seen yesterday for same, patient states pain not improved.  AAOx3.  Skin warm and dry. NAD.  Posture relaxed.

## 2020-10-25 ENCOUNTER — Ambulatory Visit: Payer: No Typology Code available for payment source

## 2020-11-01 ENCOUNTER — Ambulatory Visit: Payer: No Typology Code available for payment source

## 2020-11-21 ENCOUNTER — Emergency Department
Admission: EM | Admit: 2020-11-21 | Discharge: 2020-11-21 | Disposition: A | Discharge status: Home / Self Care | Payer: Medicare (Managed Care) | Attending: Emergency Medicine | Admitting: Emergency Medicine

## 2020-11-21 ENCOUNTER — Emergency Department: Payer: Medicare (Managed Care)

## 2020-11-21 ENCOUNTER — Other Ambulatory Visit: Payer: Self-pay

## 2020-11-21 DIAGNOSIS — I10 Essential (primary) hypertension: Secondary | ICD-10-CM | POA: Insufficient documentation

## 2020-11-21 DIAGNOSIS — R410 Disorientation, unspecified: Secondary | ICD-10-CM | POA: Diagnosis not present

## 2020-11-21 DIAGNOSIS — Z87891 Personal history of nicotine dependence: Secondary | ICD-10-CM | POA: Insufficient documentation

## 2020-11-21 DIAGNOSIS — Z7984 Long term (current) use of oral hypoglycemic drugs: Secondary | ICD-10-CM | POA: Insufficient documentation

## 2020-11-21 DIAGNOSIS — Y900 Blood alcohol level of less than 20 mg/100 ml: Secondary | ICD-10-CM | POA: Insufficient documentation

## 2020-11-21 DIAGNOSIS — R531 Weakness: Secondary | ICD-10-CM | POA: Diagnosis not present

## 2020-11-21 DIAGNOSIS — E119 Type 2 diabetes mellitus without complications: Secondary | ICD-10-CM | POA: Insufficient documentation

## 2020-11-21 DIAGNOSIS — Z7982 Long term (current) use of aspirin: Secondary | ICD-10-CM | POA: Insufficient documentation

## 2020-11-21 DIAGNOSIS — R000 Tachycardia, unspecified: Secondary | ICD-10-CM | POA: Diagnosis not present

## 2020-11-21 DIAGNOSIS — F1010 Alcohol abuse, uncomplicated: Secondary | ICD-10-CM | POA: Diagnosis not present

## 2020-11-21 DIAGNOSIS — Z79899 Other long term (current) drug therapy: Secondary | ICD-10-CM | POA: Insufficient documentation

## 2020-11-21 DIAGNOSIS — Z8546 Personal history of malignant neoplasm of prostate: Secondary | ICD-10-CM | POA: Diagnosis not present

## 2020-11-21 DIAGNOSIS — F1410 Cocaine abuse, uncomplicated: Secondary | ICD-10-CM | POA: Insufficient documentation

## 2020-11-21 LAB — URINALYSIS, COMPLETE (UACMP) WITH MICROSCOPIC
Bilirubin Urine: NEGATIVE
Glucose, UA: NEGATIVE mg/dL
Hgb urine dipstick: NEGATIVE
Ketones, ur: 5 mg/dL — AB
Leukocytes,Ua: NEGATIVE
Nitrite: NEGATIVE
Protein, ur: NEGATIVE mg/dL
Specific Gravity, Urine: 1.024 (ref 1.005–1.030)
pH: 5 (ref 5.0–8.0)

## 2020-11-21 LAB — COMPREHENSIVE METABOLIC PANEL
ALT: 47 U/L — ABNORMAL HIGH (ref 0–44)
AST: 48 U/L — ABNORMAL HIGH (ref 15–41)
Albumin: 4.2 g/dL (ref 3.5–5.0)
Alkaline Phosphatase: 93 U/L (ref 38–126)
Anion gap: 10 (ref 5–15)
BUN: 28 mg/dL — ABNORMAL HIGH (ref 8–23)
CO2: 23 mmol/L (ref 22–32)
Calcium: 9.5 mg/dL (ref 8.9–10.3)
Chloride: 100 mmol/L (ref 98–111)
Creatinine, Ser: 1.08 mg/dL (ref 0.61–1.24)
GFR, Estimated: >60 mL/min (ref >60)
Glucose, Bld: 128 mg/dL — ABNORMAL HIGH (ref 70–99)
Potassium: 4.2 mmol/L (ref 3.5–5.1)
Sodium: 133 mmol/L — ABNORMAL LOW (ref 135–145)
Total Bilirubin: 1.1 mg/dL (ref 0.3–1.2)
Total Protein: 7.6 g/dL (ref 6.5–8.1)

## 2020-11-21 LAB — URINE DRUG SCREEN, QUALITATIVE (ARMC ONLY)
Amphetamines, Ur Screen: NOT DETECTED
Barbiturates, Ur Screen: NOT DETECTED
Benzodiazepine, Ur Scrn: NOT DETECTED
Cannabinoid 50 Ng, Ur ~~LOC~~: NOT DETECTED
Cocaine Metabolite,Ur ~~LOC~~: POSITIVE — AB
MDMA (Ecstasy)Ur Screen: NOT DETECTED
Methadone Scn, Ur: NOT DETECTED
Opiate, Ur Screen: NOT DETECTED
Phencyclidine (PCP) Ur S: NOT DETECTED
Tricyclic, Ur Screen: NOT DETECTED

## 2020-11-21 LAB — CBC
HCT: 34.6 % — ABNORMAL LOW (ref 39.0–52.0)
Hemoglobin: 11.4 g/dL — ABNORMAL LOW (ref 13.0–17.0)
MCH: 30.5 pg (ref 26.0–34.0)
MCHC: 32.9 g/dL (ref 30.0–36.0)
MCV: 92.5 fL (ref 80.0–100.0)
Platelets: 241 10*3/uL (ref 150–400)
RBC: 3.74 MIL/uL — ABNORMAL LOW (ref 4.22–5.81)
RDW: 14.4 % (ref 11.5–15.5)
WBC: 11.5 10*3/uL — ABNORMAL HIGH (ref 4.0–10.5)
nRBC: 0 % (ref 0.0–0.2)

## 2020-11-21 LAB — ETHANOL: Alcohol, Ethyl (B): <10 mg/dL (ref <10)
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
## 2020-11-21 NOTE — ED Triage Notes (Signed)
See first nurse note, pt EMS from home, unable to say why he is here, states he is sick but cannot say why. Reports some abd pain that has been going on for a month  Disoriented to time, situation Equal grip and strength in BUE. Unable to lift either leg, unable to tell if pt walks at home.  Clear speech, face symmetric   Pt does admit to using marijuana, cocaine and ETOH use, unable to say when

## 2020-11-21 NOTE — ED Triage Notes (Signed)
First nurse note: pt comes ems with increased confusion and weakness. Pt's family states that he may have gotten into some illicit drugs. VSS. Hx of dementia as well.

## 2020-11-26 ENCOUNTER — Emergency Department: Payer: Medicare (Managed Care)

## 2020-11-26 ENCOUNTER — Encounter: Payer: Self-pay | Admitting: Internal Medicine

## 2020-11-26 ENCOUNTER — Inpatient Hospital Stay
Admission: EM | Admit: 2020-11-26 | Discharge: 2020-11-30 | DRG: 054 | Disposition: A | Discharge status: Hospice | Payer: Medicare (Managed Care) | Attending: Internal Medicine | Admitting: Internal Medicine

## 2020-11-26 ENCOUNTER — Other Ambulatory Visit: Payer: Self-pay

## 2020-11-26 ENCOUNTER — Observation Stay: Payer: Medicare (Managed Care)

## 2020-11-26 DIAGNOSIS — C7931 Secondary malignant neoplasm of brain: Secondary | ICD-10-CM | POA: Diagnosis not present

## 2020-11-26 DIAGNOSIS — E785 Hyperlipidemia, unspecified: Secondary | ICD-10-CM

## 2020-11-26 DIAGNOSIS — Z6820 Body mass index (BMI) 20.0-20.9, adult: Secondary | ICD-10-CM

## 2020-11-26 DIAGNOSIS — E876 Hypokalemia: Secondary | ICD-10-CM | POA: Diagnosis not present

## 2020-11-26 DIAGNOSIS — Z79899 Other long term (current) drug therapy: Secondary | ICD-10-CM

## 2020-11-26 DIAGNOSIS — R4182 Altered mental status, unspecified: Secondary | ICD-10-CM

## 2020-11-26 DIAGNOSIS — Z20822 Contact with and (suspected) exposure to covid-19: Secondary | ICD-10-CM | POA: Diagnosis present

## 2020-11-26 DIAGNOSIS — N400 Enlarged prostate without lower urinary tract symptoms: Secondary | ICD-10-CM | POA: Diagnosis present

## 2020-11-26 DIAGNOSIS — R627 Adult failure to thrive: Secondary | ICD-10-CM | POA: Diagnosis present

## 2020-11-26 DIAGNOSIS — R54 Age-related physical debility: Secondary | ICD-10-CM | POA: Diagnosis present

## 2020-11-26 DIAGNOSIS — E43 Unspecified severe protein-calorie malnutrition: Secondary | ICD-10-CM | POA: Diagnosis present

## 2020-11-26 DIAGNOSIS — E782 Mixed hyperlipidemia: Secondary | ICD-10-CM

## 2020-11-26 DIAGNOSIS — Z7952 Long term (current) use of systemic steroids: Secondary | ICD-10-CM

## 2020-11-26 DIAGNOSIS — R296 Repeated falls: Secondary | ICD-10-CM

## 2020-11-26 DIAGNOSIS — Z66 Do not resuscitate: Secondary | ICD-10-CM | POA: Diagnosis not present

## 2020-11-26 DIAGNOSIS — K5900 Constipation, unspecified: Secondary | ICD-10-CM

## 2020-11-26 DIAGNOSIS — C61 Malignant neoplasm of prostate: Secondary | ICD-10-CM | POA: Diagnosis present

## 2020-11-26 DIAGNOSIS — E7800 Pure hypercholesterolemia, unspecified: Secondary | ICD-10-CM | POA: Diagnosis present

## 2020-11-26 DIAGNOSIS — G8929 Other chronic pain: Secondary | ICD-10-CM | POA: Diagnosis present

## 2020-11-26 DIAGNOSIS — E119 Type 2 diabetes mellitus without complications: Secondary | ICD-10-CM | POA: Diagnosis not present

## 2020-11-26 DIAGNOSIS — Z7984 Long term (current) use of oral hypoglycemic drugs: Secondary | ICD-10-CM

## 2020-11-26 DIAGNOSIS — G9341 Metabolic encephalopathy: Secondary | ICD-10-CM | POA: Diagnosis present

## 2020-11-26 DIAGNOSIS — I10 Essential (primary) hypertension: Secondary | ICD-10-CM

## 2020-11-26 DIAGNOSIS — Z8042 Family history of malignant neoplasm of prostate: Secondary | ICD-10-CM

## 2020-11-26 DIAGNOSIS — T451X5A Adverse effect of antineoplastic and immunosuppressive drugs, initial encounter: Secondary | ICD-10-CM

## 2020-11-26 DIAGNOSIS — Z7982 Long term (current) use of aspirin: Secondary | ICD-10-CM

## 2020-11-26 DIAGNOSIS — E11649 Type 2 diabetes mellitus with hypoglycemia without coma: Secondary | ICD-10-CM | POA: Diagnosis not present

## 2020-11-26 DIAGNOSIS — R410 Disorientation, unspecified: Secondary | ICD-10-CM | POA: Diagnosis not present

## 2020-11-26 DIAGNOSIS — C787 Secondary malignant neoplasm of liver and intrahepatic bile duct: Secondary | ICD-10-CM

## 2020-11-26 DIAGNOSIS — Z515 Encounter for palliative care: Secondary | ICD-10-CM

## 2020-11-26 DIAGNOSIS — C7972 Secondary malignant neoplasm of left adrenal gland: Secondary | ICD-10-CM | POA: Diagnosis present

## 2020-11-26 DIAGNOSIS — C7971 Secondary malignant neoplasm of right adrenal gland: Secondary | ICD-10-CM | POA: Diagnosis present

## 2020-11-26 DIAGNOSIS — D6481 Anemia due to antineoplastic chemotherapy: Secondary | ICD-10-CM

## 2020-11-26 DIAGNOSIS — C7951 Secondary malignant neoplasm of bone: Secondary | ICD-10-CM | POA: Diagnosis present

## 2020-11-26 DIAGNOSIS — E1165 Type 2 diabetes mellitus with hyperglycemia: Secondary | ICD-10-CM | POA: Diagnosis present

## 2020-11-26 DIAGNOSIS — Z87891 Personal history of nicotine dependence: Secondary | ICD-10-CM

## 2020-11-26 DIAGNOSIS — Z7951 Long term (current) use of inhaled steroids: Secondary | ICD-10-CM

## 2020-11-26 DIAGNOSIS — E871 Hypo-osmolality and hyponatremia: Secondary | ICD-10-CM | POA: Diagnosis present

## 2020-11-26 DIAGNOSIS — K219 Gastro-esophageal reflux disease without esophagitis: Secondary | ICD-10-CM

## 2020-11-26 DIAGNOSIS — G919 Hydrocephalus, unspecified: Secondary | ICD-10-CM | POA: Diagnosis not present

## 2020-11-26 LAB — CBC
HCT: 34.2 % — ABNORMAL LOW (ref 39.0–52.0)
Hemoglobin: 11.4 g/dL — ABNORMAL LOW (ref 13.0–17.0)
MCH: 29.7 pg (ref 26.0–34.0)
MCHC: 33.3 g/dL (ref 30.0–36.0)
MCV: 89.1 fL (ref 80.0–100.0)
Platelets: 236 10*3/uL (ref 150–400)
RBC: 3.84 MIL/uL — ABNORMAL LOW (ref 4.22–5.81)
RDW: 14 % (ref 11.5–15.5)
WBC: 11.9 10*3/uL — ABNORMAL HIGH (ref 4.0–10.5)
nRBC: 0 % (ref 0.0–0.2)

## 2020-11-26 LAB — HEPATIC FUNCTION PANEL
ALT: 57 U/L — ABNORMAL HIGH (ref 0–44)
AST: 60 U/L — ABNORMAL HIGH (ref 15–41)
Albumin: 3.5 g/dL (ref 3.5–5.0)
Alkaline Phosphatase: 100 U/L (ref 38–126)
Bilirubin, Direct: 0.4 mg/dL — ABNORMAL HIGH (ref 0.0–0.2)
Indirect Bilirubin: 0.9 mg/dL (ref 0.3–0.9)
Total Bilirubin: 1.3 mg/dL — ABNORMAL HIGH (ref 0.3–1.2)
Total Protein: 7.1 g/dL (ref 6.5–8.1)

## 2020-11-26 LAB — BASIC METABOLIC PANEL
Anion gap: 8 (ref 5–15)
BUN: 20 mg/dL (ref 8–23)
CO2: 25 mmol/L (ref 22–32)
Calcium: 9.2 mg/dL (ref 8.9–10.3)
Chloride: 97 mmol/L — ABNORMAL LOW (ref 98–111)
Creatinine, Ser: 0.92 mg/dL (ref 0.61–1.24)
GFR, Estimated: >60 mL/min (ref >60)
Glucose, Bld: 55 mg/dL — ABNORMAL LOW (ref 70–99)
Potassium: 4 mmol/L (ref 3.5–5.1)
Sodium: 130 mmol/L — ABNORMAL LOW (ref 135–145)

## 2020-11-26 LAB — RESP PANEL BY RT-PCR (FLU A&B, COVID) ARPGX2
Influenza A by PCR: NEGATIVE
Influenza B by PCR: NEGATIVE
SARS Coronavirus 2 by RT PCR: NEGATIVE

## 2020-11-26 LAB — TSH: TSH: 0.918 u[IU]/mL (ref 0.350–4.500)

## 2020-11-26 LAB — TROPONIN I (HIGH SENSITIVITY): Troponin I (High Sensitivity): 8 ng/L (ref <18)

## 2020-11-26 LAB — CBG MONITORING, ED
Glucose-Capillary: 50 mg/dL — ABNORMAL LOW (ref 70–99)
Glucose-Capillary: 52 mg/dL — ABNORMAL LOW (ref 70–99)
Glucose-Capillary: 182 mg/dL — ABNORMAL HIGH (ref 70–99)

## 2020-11-26 LAB — AMMONIA: Ammonia: <9 umol/L — ABNORMAL LOW (ref 9–35)

## 2020-11-26 MED ORDER — DEXTROSE 50 % IV SOLN: 1.0000 | Freq: Once | INTRAVENOUS | Status: DC

## 2020-11-26 MED ORDER — ONDANSETRON HCL 4 MG/2ML IJ SOLN
4.0000 mg | Freq: Four times a day (QID) | INTRAMUSCULAR | Status: DC | PRN
Start: 2020-11-26 — End: 2020-12-01

## 2020-11-26 MED ORDER — KETOROLAC TROMETHAMINE 15 MG/ML IJ SOLN
15.0000 mg | Freq: Four times a day (QID) | INTRAMUSCULAR | Status: AC | PRN
  Filled 2020-11-26: qty 1

## 2020-11-26 MED ORDER — SODIUM CHLORIDE 0.9 % IV SOLN: INTRAVENOUS | Status: DC

## 2020-11-26 MED ORDER — ACETAMINOPHEN 500 MG PO TABS
1000.0000 mg | ORAL_TABLET | Freq: Four times a day (QID) | ORAL | Status: AC | PRN
  Administered 2020-11-29: 1000 mg via ORAL
  Filled 2020-11-26: qty 2

## 2020-11-26 MED ORDER — PANTOPRAZOLE SODIUM 40 MG PO TBEC
40.0000 mg | DELAYED_RELEASE_TABLET | Freq: Every day | ORAL | Status: DC
  Administered 2020-11-27 – 2020-11-30 (×4): 40 mg via ORAL
  Filled 2020-11-26 (×4): qty 1

## 2020-11-26 MED ORDER — DEXTROSE 50 % IV SOLN: 1.0000 | Freq: Once | INTRAVENOUS | Status: AC

## 2020-11-26 MED ORDER — DEXTROSE 50 % IV SOLN
INTRAVENOUS | Status: AC
  Administered 2020-11-26: 50 mL via INTRAVENOUS
  Filled 2020-11-26: qty 50

## 2020-11-26 MED ORDER — LACTATED RINGERS IV BOLUS: 1000.0000 mL | Freq: Once | INTRAVENOUS | Status: DC

## 2020-11-26 MED ORDER — MORPHINE SULFATE (PF) 2 MG/ML IV SOLN: 0.5000 mg | INTRAVENOUS | Status: DC | PRN

## 2020-11-26 MED ORDER — ENOXAPARIN SODIUM 40 MG/0.4ML IJ SOSY
40.0000 mg | PREFILLED_SYRINGE | INTRAMUSCULAR | Status: DC
  Administered 2020-11-27 – 2020-11-30 (×4): 40 mg via SUBCUTANEOUS
  Filled 2020-11-26 (×5): qty 0.4

## 2020-11-26 MED ORDER — ASPIRIN EC 81 MG PO TBEC
81.0000 mg | DELAYED_RELEASE_TABLET | Freq: Every day | ORAL | Status: DC
  Administered 2020-11-26 – 2020-11-30 (×5): 81 mg via ORAL
  Filled 2020-11-26 (×5): qty 1

## 2020-11-26 MED ORDER — ACETAMINOPHEN 650 MG RE SUPP: 650.0000 mg | Freq: Four times a day (QID) | RECTAL | Status: AC | PRN

## 2020-11-26 MED ORDER — DEXAMETHASONE 0.5 MG PO TABS
2.0000 mg | ORAL_TABLET | Freq: Two times a day (BID) | ORAL | Status: DC
  Administered 2020-11-27 – 2020-11-30 (×8): 2 mg via ORAL
  Filled 2020-11-26 (×11): qty 4

## 2020-11-26 MED ORDER — ONDANSETRON HCL 4 MG PO TABS: 4.0000 mg | ORAL_TABLET | Freq: Four times a day (QID) | ORAL | Status: DC | PRN

## 2020-11-26 MED ORDER — DEXTROSE 50 % IV SOLN: 1.0000 | INTRAVENOUS | Status: DC | PRN

## 2020-11-26 NOTE — ED Notes (Signed)
Pt son states that pt has not been able to attend [facility] appts d/t legs giving out and not being able to transport.  Legs started progressively giving out weaker approx 2 weeks ago.
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
## 2020-11-26 NOTE — ED Notes (Addendum)
Pt has not had BM in 3 weeks, urinating very little, poor appetite. Has had difficulty taking meds.

## 2020-11-26 NOTE — ED Triage Notes (Signed)
First Nurse Note:  [place] last night, hit head.  [place] out of bed.  Has history of liver ca.  C/O weakness today.  VS wnl.

## 2020-11-26 NOTE — ED Notes (Signed)
Son states that pt fell 5X last night. Pt rolled out of bed.

## 2020-11-26 NOTE — ED Notes (Signed)
Gave pt apple juice for BG of 50.

## 2020-11-26 NOTE — Progress Notes (Signed)
Pt is unable to perform the task of a Flutter Valve or IS due to confusion/AMS. RN aware.

## 2020-11-26 NOTE — ED Triage Notes (Addendum)
Pt here with family with a fall this morning and 3 falls last night. Pt has small abrasion to head where he fell. Pt's family states that he is weak and has stage 4 liver CA and has not been eating and drinking like normal. Pt denies pain.
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
## 2020-11-26 NOTE — Plan of Care (Signed)

## 2020-11-27 ENCOUNTER — Inpatient Hospital Stay: Payer: Medicare (Managed Care)

## 2020-11-27 DIAGNOSIS — Z515 Encounter for palliative care: Secondary | ICD-10-CM | POA: Diagnosis not present

## 2020-11-27 DIAGNOSIS — Z79899 Other long term (current) drug therapy: Secondary | ICD-10-CM | POA: Diagnosis not present

## 2020-11-27 DIAGNOSIS — E43 Unspecified severe protein-calorie malnutrition: Secondary | ICD-10-CM | POA: Diagnosis present

## 2020-11-27 DIAGNOSIS — Z7984 Long term (current) use of oral hypoglycemic drugs: Secondary | ICD-10-CM | POA: Diagnosis not present

## 2020-11-27 DIAGNOSIS — E871 Hypo-osmolality and hyponatremia: Secondary | ICD-10-CM | POA: Diagnosis present

## 2020-11-27 DIAGNOSIS — C7971 Secondary malignant neoplasm of right adrenal gland: Secondary | ICD-10-CM | POA: Diagnosis present

## 2020-11-27 DIAGNOSIS — C7972 Secondary malignant neoplasm of left adrenal gland: Secondary | ICD-10-CM | POA: Diagnosis present

## 2020-11-27 DIAGNOSIS — G9341 Metabolic encephalopathy: Secondary | ICD-10-CM | POA: Diagnosis present

## 2020-11-27 DIAGNOSIS — R296 Repeated falls: Secondary | ICD-10-CM | POA: Diagnosis not present

## 2020-11-27 DIAGNOSIS — D6481 Anemia due to antineoplastic chemotherapy: Secondary | ICD-10-CM | POA: Diagnosis present

## 2020-11-27 DIAGNOSIS — Z8042 Family history of malignant neoplasm of prostate: Secondary | ICD-10-CM | POA: Diagnosis not present

## 2020-11-27 DIAGNOSIS — Z20822 Contact with and (suspected) exposure to covid-19: Secondary | ICD-10-CM | POA: Diagnosis present

## 2020-11-27 DIAGNOSIS — C7931 Secondary malignant neoplasm of brain: Secondary | ICD-10-CM | POA: Diagnosis present

## 2020-11-27 DIAGNOSIS — C61 Malignant neoplasm of prostate: Secondary | ICD-10-CM | POA: Diagnosis present

## 2020-11-27 DIAGNOSIS — Z7982 Long term (current) use of aspirin: Secondary | ICD-10-CM | POA: Diagnosis not present

## 2020-11-27 DIAGNOSIS — C787 Secondary malignant neoplasm of liver and intrahepatic bile duct: Secondary | ICD-10-CM | POA: Diagnosis present

## 2020-11-27 DIAGNOSIS — R410 Disorientation, unspecified: Secondary | ICD-10-CM | POA: Diagnosis not present

## 2020-11-27 DIAGNOSIS — E119 Type 2 diabetes mellitus without complications: Secondary | ICD-10-CM | POA: Diagnosis not present

## 2020-11-27 DIAGNOSIS — Z66 Do not resuscitate: Secondary | ICD-10-CM | POA: Diagnosis not present

## 2020-11-27 DIAGNOSIS — Z7952 Long term (current) use of systemic steroids: Secondary | ICD-10-CM | POA: Diagnosis not present

## 2020-11-27 DIAGNOSIS — Z7951 Long term (current) use of inhaled steroids: Secondary | ICD-10-CM | POA: Diagnosis not present

## 2020-11-27 DIAGNOSIS — Z7189 Other specified counseling: Secondary | ICD-10-CM | POA: Diagnosis not present

## 2020-11-27 DIAGNOSIS — E1165 Type 2 diabetes mellitus with hyperglycemia: Secondary | ICD-10-CM | POA: Diagnosis present

## 2020-11-27 DIAGNOSIS — E11649 Type 2 diabetes mellitus with hypoglycemia without coma: Secondary | ICD-10-CM | POA: Diagnosis not present

## 2020-11-27 DIAGNOSIS — C7951 Secondary malignant neoplasm of bone: Secondary | ICD-10-CM | POA: Diagnosis present

## 2020-11-27 DIAGNOSIS — G919 Hydrocephalus, unspecified: Secondary | ICD-10-CM | POA: Diagnosis not present

## 2020-11-27 DIAGNOSIS — R4182 Altered mental status, unspecified: Secondary | ICD-10-CM | POA: Diagnosis present

## 2020-11-27 DIAGNOSIS — E7800 Pure hypercholesterolemia, unspecified: Secondary | ICD-10-CM | POA: Diagnosis present

## 2020-11-27 DIAGNOSIS — I10 Essential (primary) hypertension: Secondary | ICD-10-CM | POA: Diagnosis present

## 2020-11-27 LAB — BASIC METABOLIC PANEL
Anion gap: 7 (ref 5–15)
BUN: 18 mg/dL (ref 8–23)
CO2: 25 mmol/L (ref 22–32)
Calcium: 8.6 mg/dL — ABNORMAL LOW (ref 8.9–10.3)
Chloride: 97 mmol/L — ABNORMAL LOW (ref 98–111)
Creatinine, Ser: 0.88 mg/dL (ref 0.61–1.24)
GFR, Estimated: >60 mL/min (ref >60)
Glucose, Bld: 106 mg/dL — ABNORMAL HIGH (ref 70–99)
Potassium: 3.5 mmol/L (ref 3.5–5.1)
Sodium: 129 mmol/L — ABNORMAL LOW (ref 135–145)

## 2020-11-27 LAB — CBC
HCT: 31.6 % — ABNORMAL LOW (ref 39.0–52.0)
Hemoglobin: 10.7 g/dL — ABNORMAL LOW (ref 13.0–17.0)
MCH: 30.3 pg (ref 26.0–34.0)
MCHC: 33.9 g/dL (ref 30.0–36.0)
MCV: 89.5 fL (ref 80.0–100.0)
Platelets: 220 10*3/uL (ref 150–400)
RBC: 3.53 MIL/uL — ABNORMAL LOW (ref 4.22–5.81)
RDW: 14 % (ref 11.5–15.5)
WBC: 11.7 10*3/uL — ABNORMAL HIGH (ref 4.0–10.5)
nRBC: 0 % (ref 0.0–0.2)

## 2020-11-27 LAB — GLUCOSE, CAPILLARY: Glucose-Capillary: 148 mg/dL — ABNORMAL HIGH (ref 70–99)

## 2020-11-27 LAB — VITAMIN B12: Vitamin B-12: 946 pg/mL — ABNORMAL HIGH (ref 180–914)

## 2020-11-27 MED ORDER — GADOBUTROL 1 MMOL/ML IV SOLN
6.0000 mL | Freq: Once | INTRAVENOUS | Status: AC | PRN
  Administered 2020-11-27: 6 mL via INTRAVENOUS

## 2020-11-27 MED ORDER — HALOPERIDOL LACTATE 2 MG/ML PO CONC
2.0000 mg | Freq: Four times a day (QID) | ORAL | Status: DC | PRN
  Administered 2020-11-27 – 2020-11-28 (×3): 2 mg via ORAL
  Filled 2020-11-27 (×6): qty 1

## 2020-11-27 MED ORDER — ENSURE ENLIVE PO LIQD
237.0000 mL | Freq: Two times a day (BID) | ORAL | Status: DC
  Administered 2020-11-27 – 2020-11-30 (×7): 237 mL via ORAL

## 2020-11-27 MED ORDER — ADULT MULTIVITAMIN W/MINERALS CH
1.0000 | ORAL_TABLET | Freq: Every day | ORAL | Status: DC
  Administered 2020-11-27 – 2020-11-30 (×4): 1 via ORAL
  Filled 2020-11-27 (×4): qty 1
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
## 2020-11-27 NOTE — Progress Notes (Signed)
Initial Nutrition Assessment  DOCUMENTATION CODES:  Not applicable  INTERVENTION:  Liberalize diet to carb modified to encourage PO intake Obtain new measured weight to assess for loss Ensure Enlive po BID, each supplement provides 350 kcal and 20 grams of protein MVI with minerals daily  NUTRITION DIAGNOSIS:  Increased nutrient needs related to cancer and cancer related treatments as evidenced by estimated needs.  GOAL:  Patient will meet greater than or equal to 90% of their needs  MONITOR:  PO intake, Supplement acceptance, Weight trends  REASON FOR ASSESSMENT:  Consult Assessment of nutrition requirement/status  ASSESSMENT:  65 y.o. male with medical history significant for prostate cancer with mets to to lymph nodes, bone, adrenals, and liver, HTN, HLD, anemia, DM type 2 and, GERD, presented to ED with confusion, multiple falls, and family requesting hospice/palliative care consultation. Pt with weakness and AMS in ED, unable to provide a hx.  Pt unable to provide a hx at this time due to confusion. Oriented to self.   Significant weight loss of 15% noted over the last month in chart but unsure of accuracy. Majority of pt's weight's are stated. Will request new measured weight to assess for loss. No intake recorded yet.  Based on pt's weight and family's report of declining functional status and poor PO intake, pt likely has a degree of malnutrition present. Will recommend diet liberalization and nutrition supplements to augment intake.  Nutritionally Relevant Medications: Scheduled Meds:  dexamethasone  2 mg Oral BID   pantoprazole  40 mg Oral Daily   Continuous Infusions:  sodium chloride 125 mL/hr at 11/26/20 2232   PRN Meds: ondansetron  Labs Reviewed: Na 129, chloride 97 HgbA1c 6/2% (4/25, [place]) SBG ranges from 50-182 mg/dL over the last 24 hours  NUTRITION - FOCUSED PHYSICAL EXAM: Defer to in-person assessment  Diet Order:   Diet Order              Diet Carb Modified Fluid consistency: Thin; Room service appropriate? Yes  Diet effective now                   EDUCATION NEEDS:  Not appropriate for education at this time  Skin:  Skin Assessment: Reviewed RN Assessment  Last BM:  8/15, type 7  Height:  Ht Readings from Last 1 Encounters:  11/26/20 6' (1.829 m)    Weight:  Wt Readings from Last 1 Encounters:  11/26/20 72.6 kg    Ideal Body Weight:  80.9 kg  BMI:  Body mass index is 21.7 kg/m.  Estimated Nutritional Needs:  Kcal:  2100-2300 kcal/d Protein:  110-120 g/d Fluid:  2.2-2.4 kcal/d   Ranell Patrick, RD, LDN Clinical Dietitian Pager on [place]

## 2020-11-28 ENCOUNTER — Inpatient Hospital Stay: Payer: Medicare (Managed Care)

## 2020-11-28 ENCOUNTER — Encounter: Payer: Self-pay | Admitting: Family Medicine

## 2020-11-28 DIAGNOSIS — Z515 Encounter for palliative care: Secondary | ICD-10-CM

## 2020-11-28 DIAGNOSIS — G9341 Metabolic encephalopathy: Secondary | ICD-10-CM

## 2020-11-28 DIAGNOSIS — Z7189 Other specified counseling: Secondary | ICD-10-CM

## 2020-11-28 LAB — CBC
HCT: 32.2 % — ABNORMAL LOW (ref 39.0–52.0)
Hemoglobin: 10.8 g/dL — ABNORMAL LOW (ref 13.0–17.0)
MCH: 30 pg (ref 26.0–34.0)
MCHC: 33.5 g/dL (ref 30.0–36.0)
MCV: 89.4 fL (ref 80.0–100.0)
Platelets: 226 10*3/uL (ref 150–400)
RBC: 3.6 MIL/uL — ABNORMAL LOW (ref 4.22–5.81)
RDW: 14 % (ref 11.5–15.5)
WBC: 10.5 10*3/uL (ref 4.0–10.5)
nRBC: 0 % (ref 0.0–0.2)

## 2020-11-28 LAB — BASIC METABOLIC PANEL
Anion gap: 10 (ref 5–15)
BUN: 16 mg/dL (ref 8–23)
CO2: 24 mmol/L (ref 22–32)
Calcium: 9.3 mg/dL (ref 8.9–10.3)
Chloride: 96 mmol/L — ABNORMAL LOW (ref 98–111)
Creatinine, Ser: 0.81 mg/dL (ref 0.61–1.24)
GFR, Estimated: >60 mL/min (ref >60)
Glucose, Bld: 126 mg/dL — ABNORMAL HIGH (ref 70–99)
Potassium: 3.2 mmol/L — ABNORMAL LOW (ref 3.5–5.1)
Sodium: 130 mmol/L — ABNORMAL LOW (ref 135–145)

## 2020-11-28 LAB — GLUCOSE, CAPILLARY
Glucose-Capillary: 103 mg/dL — ABNORMAL HIGH (ref 70–99)
Glucose-Capillary: 150 mg/dL — ABNORMAL HIGH (ref 70–99)

## 2020-11-28 MED ORDER — POTASSIUM CHLORIDE CRYS ER 20 MEQ PO TBCR
40.0000 meq | EXTENDED_RELEASE_TABLET | Freq: Once | ORAL | Status: AC
  Administered 2020-11-28: 40 meq via ORAL
  Filled 2020-11-28: qty 2

## 2020-11-28 MED ORDER — IOHEXOL 350 MG/ML SOLN
50.0000 mL | Freq: Once | INTRAVENOUS | Status: AC | PRN
  Administered 2020-11-28: 50 mL via INTRAVENOUS
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
## 2020-11-28 NOTE — Consult Note (Signed)
Consultation Note Date: 11/28/2020   Patient Name: ELIZABETH GLINSKY  DOB: [birth year]  MRN: [id number]  Age / Sex: 65 y.o., male  PCP: Dion Body, MD Referring Physician: Nolberto Hanlon, MD  Reason for Consultation: Establishing goals of care  HPI/Patient Profile: 65 y.o. 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   Clinical Assessment and Goals of Care: I have reviewed medical records including EPIC notes, labs and imaging, received report from RN, assessed the patient.  Mr. Dunnett is lying quietly in bed.  He looks at me making and somewhat keeping eye contact.  He is alert, oriented to self only, states we are at the "gym".  I am not sure that he can make his basic needs known.  There is no family at bedside at this time.  Call to wife, Omareon Hermoso to discuss diagnosis prognosis, [place], EOL wishes, disposition and options.  I introduced Palliative Medicine as specialized medical care for people living with serious illness. It focuses on providing relief from the symptoms and stress of a serious illness. The goal is to improve quality of life for both the patient and the family.  We discussed a brief life review of the patient.  Mr. and Mrs. Vazquez have been married for about 2 years.  Mr. Engles has adult sons to are active in their life, providing assistance when Mr. Belleau falls.  Mrs. Atz states that this confusion started about 1 week ago.  She states that he has been falling a lot, falling out of bed at night also.    Mrs. Pourciau shares that he was to follow-up with [facility] oncology 8/22.  She shares that they had an appointment on August 1, and were scheduled  to have every 3-week chemotherapy, but the doctor wanted an MRI first which could not be scheduled at that time.  We then focused on their current illness, confusion, frailty.  I share that Mr. Janna Arch looks very weak for further chemotherapy.  Mrs. Watrous readily agrees.  The natural disease trajectory and expectations at EOL were discussed.  She does endorse that he has stage IV cancer, but is unable to give prognosis.  Advanced directives, concepts specific to code status, artifical feeding and hydration, and rehospitalization were considered and discussed.  We talked about the realities of CPR and intubation, that these do not change his current cancer burden.  I shared that the medical team would recommend "treat the treatable, but allowing natural passing".  I encouraged her to discuss these choices with Mr. Tennenbaum sons.  Palliative Care services outpatient were explained and offered.  Mrs. Pelfrey is agreeable to short-term rehab if facility can be found.  She states that he will be 65 on 9[birth year]8 and has signed up for Medicare.  Discussed the importance of continued conversation with family and the  medical providers regarding overall plan of care and treatment options, ensuring decisions are within the context of the patient's values and GOCs.  Questions and concerns were addressed.   The family was encouraged to call with questions or concerns.  PMT will continue to support holistically.   HCPOA   NEXT OF KIN -wife of 2 years, [place].  Blanch Media shares that Mr. Smet has adult sons who are active in their life.  She shares that she discusses choices with oldest son.    SUMMARY OF RECOMMENDATIONS   At this point continue to treat the treatable Considering CODE STATUS Agreeable to short-term rehab if can find facility Was scheduled for [facility] 8/22  Code Status/Advance Care Planning: Full code -we talked about the concept of "treat the treatable but allowing  natural death".  We talked about the reality of Mr. Abbondanza acute and chronic health concerns and that CPR/intubation would not change his cancer burden or his frailty/falls.  Symptom Management:  Per hospitalist, no additional needs at this time.  Palliative Prophylaxis:  Frequent Pain Assessment and Oral Care  Additional Recommendations (Limitations, Scope, Preferences): Full Scope Treatment  Psycho-social/Spiritual:  Desire for further Chaplaincy support:no Additional Recommendations: Caregiving  Support/Resources and Education on Hospice  Prognosis:  Unable to determine, based on outcomes.  Guarded at this point.  Months would not be surprising.  Discharge Planning:  To be determined, based on outcomes/insurance.  Mrs. Gnagey is agreeable to short-term rehab.       Primary Diagnoses: Present on Admission:  Altered mental status  Prostate cancer ([place])  Hyponatremia  Acute metabolic encephalopathy   I have reviewed the medical record, interviewed the patient and family, and examined the patient. The following aspects are pertinent.  Past Medical History:  Diagnosis Date   Acute appendicitis 08/01/2018   Anemia    BPH (benign prostatic hyperplasia)    Diabetes mellitus type 2, uncomplicated (HCC)    Diabetes mellitus without complication (HCC)    Duodenal diverticulum    Duodenitis    Erectile dysfunction    History of colon polyps    History of normocytic normochromic anemia    Hypertension    Iron deficiency anemia, unspecified    Prostate cancer ([place]) 2015   prostate radiation   Pure hypercholesterolemia    Radiation proctitis    Social History   Socioeconomic History   Marital status: Married    Spouse name: Not on file   Number of children: Not on file   Years of education: Not on file   Highest education level: Not on file  Occupational History   Not on file  Tobacco Use   Smoking status: Former    Years: 10.00    Types: Cigarettes    Quit  date: 04/14/2002    Years since quitting: 18.6   Smokeless tobacco: Never  Vaping Use   Vaping Use: Never used  Substance and Sexual Activity   Alcohol use: Yes    Alcohol/week: 0.0 standard drinks   Drug use: No   Sexual activity: Not on file  Other Topics Concern   Not on file  Social History Narrative   Not on file   Social Determinants of Health   Financial Resource Strain: Not on file  Food Insecurity: Not on file  Transportation Needs: Not on file  Physical Activity: Not on file  Stress: Not on file  Social Connections: Not on file   Family History  Problem Relation Age of Onset  Prostate cancer Father    Prostate cancer Maternal Uncle    Scheduled Meds:  aspirin EC  81 mg Oral Daily   dexamethasone  2 mg Oral BID   enoxaparin (LOVENOX) injection  40 mg Subcutaneous Q24H   feeding supplement  237 mL Oral BID BM   multivitamin with minerals  1 tablet Oral Daily   pantoprazole  40 mg Oral Daily   Continuous Infusions: PRN Meds:.acetaminophen **OR** acetaminophen, dextrose, haloperidol, morphine injection, ondansetron **OR** ondansetron (ZOFRAN) IV Medications Prior to Admission:  Prior to Admission medications   Medication Sig Start Date End Date Taking? Authorizing Provider  dexamethasone (DECADRON) 2 MG tablet Take 2 mg by mouth 2 (two) times daily. 11/12/20  Yes [provider]  enalapril (VASOTEC) 20 MG tablet Take 1 tablet (20 mg total) by mouth daily. 06/23/20  Yes Eulogio Bear U, DO  ferrous sulfate 325 (65 FE) MG tablet Take 325 mg by mouth 2 (two) times daily.    Yes [provider]  glimepiride (AMARYL) 2 MG tablet Take 2-4 mg by mouth 2 (two) times daily. Two tablets in the morning and 1 tablet in the evening 03/15/18  Yes [provider]  metFORMIN (GLUCOPHAGE-XR) 500 MG 24 hr tablet Take 500 mg by mouth 2 (two) times daily.   Yes [provider]  oxyCODONE (OXY IR/ROXICODONE) 5 MG immediate release tablet Take 5-10 mg by  mouth every 4 (four) hours as needed for severe pain. 07/27/20  Yes [provider]  pantoprazole (PROTONIX) 40 MG tablet Take 1 tablet (40 mg total) by mouth daily. 10/18/20 10/18/21 Yes Nena Polio, MD  tamsulosin (FLOMAX) 0.4 MG CAPS capsule Take 0.4 mg by mouth at bedtime.    Yes [provider]  acidophilus (RISAQUAD) CAPS capsule Take 1 capsule by mouth daily. Patient not taking: No sig reported 06/24/20   Eulogio Bear U, DO  dexamethasone (DECADRON) 4 MG tablet Take 8 mg by mouth daily. 02/21/20   [provider]  dicyclomine (BENTYL) 10 MG capsule Take 1 capsule (10 mg total) by mouth 3 (three) times daily as needed for up to 14 days (abdominal pain). 10/19/20 11/02/20  Nance Pear, MD  Leuprolide Acetate (LUPRON IJ) Inject 1 Dose as directed every 6 (six) months.    [provider]  ondansetron (ZOFRAN ODT) 4 MG disintegrating tablet Take 1 tablet (4 mg total) by mouth every 8 (eight) hours as needed for nausea or vomiting. 07/27/20   Duffy Bruce, MD  pantoprazole (PROTONIX) 40 MG tablet Take 1 tablet (40 mg total) by mouth daily for 14 days. 07/27/20 08/10/20  Duffy Bruce, MD  potassium chloride SA (K-DUR) 20 MEQ tablet Take 20 mEq by mouth 2 (two) times daily. 12/18/17 06/21/20  [provider]  predniSONE (DELTASONE) 10 MG tablet Take 10 mg by mouth daily with breakfast. Patient not taking: Reported on 11/26/2020    [provider]  prochlorperazine (COMPAZINE) 10 MG tablet Take 10 mg by mouth every 6 (six) hours as needed. 05/04/20   [provider]  sildenafil (REVATIO) 20 MG tablet Take 20-60 mg by mouth as needed (prior to sexual intercourse).    [provider]  sucralfate (CARAFATE) 1 g tablet Take 1 g by mouth 4 (four) times daily. 10/03/20   [provider]  omeprazole (PRILOSEC) 20 MG capsule Take 20 mg by mouth daily. 06/13/20 07/27/20  [provider]   No Known Allergies Review of Systems   Unable to perform ROS:  Other   Physical Exam Vitals and nursing note reviewed.  Constitutional:      General: He is not in acute distress.    Appearance: He is ill-appearing.  HENT:     Head: Atraumatic.  Cardiovascular:     Rate and Rhythm: Normal rate.  Pulmonary:     Effort: Pulmonary effort is normal. No respiratory distress.  Abdominal:     General: Abdomen is flat.  Neurological:     Mental Status: He is alert.     Comments: Oriented to self only  Psychiatric:     Comments: Calm and cooperative    Vital Signs: BP (!) 128/100 (BP Location: Right Arm)   Pulse 99   Temp 98.2 F (36.8 C)   Resp 15   Ht 6' (1.829 m)   Wt 67.2 kg   SpO2 100%   BMI 20.09 kg/m  Pain Scale: 0-10   Pain Score: Asleep   SpO2: SpO2: 100 % O2 Device:SpO2: 100 % O2 Flow Rate: .   IO: Intake/output summary:  Intake/Output Summary (Last 24 hours) at 11/28/2020 1339 Last data filed at 11/28/2020 1030 Gross per 24 hour  Intake 240 ml  Output --  Net 240 ml    LBM: Last BM Date: 11/27/20 Baseline Weight: Weight: 72.6 kg Most recent weight: Weight: 67.2 kg     Palliative Assessment/Data:   Flowsheet Rows    Flowsheet Row Most Recent Value  Intake Tab   Referral Department Hospitalist  Unit at Time of Referral Med/Surg Unit  Palliative Care Primary Diagnosis Cancer  Date Notified 11/26/20  Palliative Care Type New Palliative care  Reason for referral Clarify Goals of Care  Date of Admission 11/26/20  Date first seen by Palliative Care 11/28/20  # of days Palliative referral response time 2 Day(s)  # of days IP prior to Palliative referral 0  Clinical Assessment   Palliative Performance Scale Score 30%  Pain Max last 24 hours Not able to report  Pain Min Last 24 hours Not able to report  Dyspnea Max Last 24 Hours Not able to report  Dyspnea Min Last 24 hours Not able to report  Psychosocial & Spiritual Assessment   Palliative Care Outcomes        Time In: 1100 Time  Out: 1210 Time Total: 70 minutes Greater than 50%  of this time was spent counseling and coordinating care related to the above assessment and plan.  Signed by: Drue Novel, NP   Please contact Palliative Medicine Team phone at [phone] for questions and concerns.  For individual provider: See Shea Evans
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
## 2020-11-28 NOTE — Evaluation (Signed)
Physical Therapy Evaluation Patient Details Name: Luis Aguilar MRN: [id number] DOB: [birth year] Today's Date: 11/28/2020   History of Present Illness  Per MD notes, pt is a 65 y.o. male who presented with failure to thrive, confusion and falls. PMH includes small cell carcinoma prostate cancer with metastasis to to lymph nodes, bone, adrenals, and liver, hypertension, hyperlipidemia, anemia, non-insulin-dependent diabetes mellitus, GERD, and multiple falls. MD assessment includes acute metabolic encephalopathy, metastatic prostate cancer, BLE weakness, hyponatremia, and type 2 diabetes.  Clinical Impression  Pt was very lethargic upon arrival requiring frequent verbal cues to keep attention but was willing to participate with PT. Pt required frequent multimodal cues and AAROM to participate during therex. Pt required max assist with all bed mobility and frequent multimodal cues for sequencing and direction. Pt demonstrated poor balance static sitting on EOB requiring min-mod assist to prevent LOB. Pt is very limited with functional mobility secondary to lethargy and generalized weakness. Further OOB activities will be attempted next visit as appropriate. Pt will benefit from PT services in a SNF setting upon discharge to safely address deficits listed in patient problem list for decreased caregiver assistance and eventual return to PLOF.     Follow Up Recommendations SNF;Supervision/Assistance - 24 hour    Equipment Recommendations  Other (comment) (TBD at next venue of care)    Recommendations for Other Services       Precautions / Restrictions Precautions Precautions: Fall Restrictions Weight Bearing Restrictions: No      Mobility  Bed Mobility Overal bed mobility: Needs Assistance Bed Mobility: Supine to Sit;Sit to Supine     Supine to sit: Max assist;HOB elevated Sit to supine: Max assist   General bed mobility comments: Max A for LEs and trunk and max verbal cueing for  sequencing and directions. Pt able to sit upright on EOB with frequent cueing for hand placement but progressively over time required more physical assistance to prevent LOB.    Transfers                 General transfer comment: Unable secondary to faitgue and difficulty maintaining upright posture.  Ambulation/Gait                Stairs            Wheelchair Mobility    Modified Rankin (Stroke Patients Only)       Balance Overall balance assessment: Needs assistance Sitting-balance support: Bilateral upper extremity supported Sitting balance-Leahy Scale: Poor Sitting balance - Comments: Initially able to maintain upright posture with verbal cueing for hand placement but after 64mn of static sitting EOB started requiring min-mod assist to prevent LOB posteriorly. Postural control: Posterior lean     Standing balance comment: Unable to assess                             Pertinent Vitals/Pain Pain Assessment: No/denies pain    Home Living Family/patient expects to be discharged to:: Private residence Living Arrangements: Spouse/significant other Available Help at Discharge: Available PRN/intermittently Type of Home: Apartment Home Access: Stairs to enter Entrance Stairs-Rails:  (Pt was unsure) Entrance Stairs-Number of Steps: 12 Home Layout: One level Home Equipment: Cane - single point Additional Comments: Pt limited providing consistent information regarding hx secondary to lethargy and confusion.    Prior Function Level of Independence: Independent with assistive device(s)         Comments: Pt reports using SPC only for community distance  and no AD in the house. Reported being independent with ADLs. Pt reports no falls but per note he has had multiple recent falls     Hand Dominance        Extremity/Trunk Assessment   Upper Extremity Assessment Upper Extremity Assessment: Generalized weakness    Lower Extremity  Assessment Lower Extremity Assessment: Generalized weakness;Difficult to assess due to impaired cognition    Cervical / Trunk Assessment Cervical / Trunk Assessment: Kyphotic  Communication   Communication: Other (comment) (Limited by lethargy and confusion)  Cognition Arousal/Alertness: Lethargic Behavior During Therapy: Flat affect Overall Cognitive Status: No family/caregiver present to determine baseline cognitive functioning                                 General Comments: Pt very lethargic initially but became a little more alert sitting on EOB. Pt AOx0 initially and after repeating questions in sitting AOx2 for name and DOB but unaware of where he was or current year. Frequent multimodal cueing throughout session for direction and sequencing.      General Comments      Exercises Total Joint Exercises Ankle Circles/Pumps: AROM;Strengthening;Both;10 reps;Supine;Other (comment) (Manual resistance) Hip ABduction/ADduction: Strengthening;Both;10 reps;AAROM;Supine Straight Leg Raises: AAROM;Strengthening;Both;10 reps;Supine Other Exercises Other Exercises: Static sitting 2-66mn requiring min guard-mod A for improved trunk control and balance   Assessment/Plan    PT Assessment Patient needs continued PT services  PT Problem List Decreased strength;Decreased activity tolerance;Decreased balance;Decreased mobility;Decreased coordination;Decreased knowledge of use of DME;Decreased safety awareness;Decreased knowledge of precautions       PT Treatment Interventions DME instruction;Gait training;Stair training;Functional mobility training;Therapeutic activities;Therapeutic exercise;Balance training;Patient/family education    PT Goals (Current goals can be found in the Care Plan section)  Acute Rehab PT Goals PT Goal Formulation: Patient unable to participate in goal setting Time For Goal Achievement: 12/11/20 Potential to Achieve Goals: Poor    Frequency Min  2X/week   Barriers to discharge        Co-evaluation               AM-PAC PT "6 Clicks" Mobility  Outcome Measure Help needed turning from your back to your side while in a flat bed without using bedrails?: A Lot Help needed moving from lying on your back to sitting on the side of a flat bed without using bedrails?: A Lot Help needed moving to and from a bed to a chair (including a wheelchair)?: Total Help needed standing up from a chair using your arms (e.g., wheelchair or bedside chair)?: Total Help needed to walk in hospital room?: Total Help needed climbing 3-5 steps with a railing? : Total 6 Click Score: 8    End of Session   Activity Tolerance: Patient limited by lethargy;Patient limited by fatigue Patient left: in bed;with call bell/phone within reach;with bed alarm set;with restraints reapplied;Other (comment) (floating heels via pillow) Nurse Communication: Mobility status (Asked if hand mitten restraints needed reapplied) PT Visit Diagnosis: Unsteadiness on feet (R26.81);Muscle weakness (generalized) (M62.81);History of falling (Z91.81);Difficulty in walking, not elsewhere classified (R26.2);Repeated falls (R29.6)    Time: [id number] Time Calculation (min) (ACUTE ONLY): 36 min   Charges:              MDayton ScrapeSPT 11/28/20, 1:30 PM

## 2020-11-29 DIAGNOSIS — C61 Malignant neoplasm of prostate: Secondary | ICD-10-CM

## 2020-11-29 DIAGNOSIS — R410 Disorientation, unspecified: Secondary | ICD-10-CM

## 2020-11-29 DIAGNOSIS — C787 Secondary malignant neoplasm of liver and intrahepatic bile duct: Secondary | ICD-10-CM

## 2020-11-29 DIAGNOSIS — R296 Repeated falls: Secondary | ICD-10-CM

## 2020-11-29 LAB — GLUCOSE, CAPILLARY: Glucose-Capillary: 163 mg/dL — ABNORMAL HIGH (ref 70–99)

## 2020-11-29 MED ORDER — ACETAMINOPHEN 650 MG RE SUPP: 650.0000 mg | Freq: Four times a day (QID) | RECTAL | Status: DC | PRN

## 2020-11-29 MED ORDER — ACETAMINOPHEN 500 MG PO TABS
1000.0000 mg | ORAL_TABLET | Freq: Four times a day (QID) | ORAL | Status: DC | PRN
  Administered 2020-11-29 – 2020-11-30 (×2): 1000 mg via ORAL
  Filled 2020-11-29 (×2): qty 2

## 2020-11-29 NOTE — Progress Notes (Signed)
Palliative:   Luis Aguilar is sitting up quietly in bed.  He greets me making and mostly keeping eye contact.  He appears acutely/chronically ill and very frail.  He is oriented to self only, and I am not sure that he is able to make his basic needs known.  There is no family at bedside at this time.  I offer Luis Aguilar something to drink and he is able to take a sip of liquid without overt signs and symptoms of aspiration.  He declines any further drink.  Conference with bedside nursing staff  Call to wife of 2 years, [place]. Left VM.  Return call to [place].  Luis Aguilar shares that she talk with Mr. Software engineer [facility].  She shares that she was told that his cancer has progressed to his brain and he is no longer a candidate for treatment.  She tells me that his oncologist suggested hospice.  Luis Aguilar tells me that she is agreeable to hospice, and feels that she cannot care for him at home.  We talked about residential hospice, that people go there to "let nature take its course", to die.  She states understanding.  We talked about what is and is not provided at hospice such as no IV fluids or medicines that are not focused on comfort.  Provider choice offered.  She states that she lives near [facility] on [address]. and that would be her choice.  She requested hospice call her 8/19 in the a.m. as she is going to work soon and does not want to miss their call.  Luis Aguilar and I talk about DNR status.  She tells me that she still needs to speak to his sons about CODE STATUS, but she has told them that his cancer has progressed to his brain.  She tells me that she would like to tell them, "that this is happening, we need to accept it".  Conference with attending, bedside nursing staff, transition of care team related to patient condition, needs, goals of care, disposition.  Plan: Requesting comfort and dignity at end-of-life, residential hospice.  Comfort care orders to  be implemented once he is accepted to hospice care. Prognosis: 2 weeks or less based on advancing cancer burden, frailty with multiple falls, poor by mouth intake with 20 pound weight loss in the last month, family's desire to focus on comfort and dignity, let nature take its course.  95 minutes  Quinn Axe, NP Palliative Medicine Team  Team Phone [phone] Greater than 50% of this time was spent counseling and coordinating care related to the above assessment and plan.
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
## 2020-11-29 NOTE — Assessment & Plan Note (Addendum)
#  65 year old male patient with a history of metastatic small cell carcinoma of the prostate currently on immunotherapy is currently admitted to hospital for generalized weakness/noted to have mental status changes  #Metastatic small cell carcinoma of the prostate-on chemoimmunotherapy; with progression of disease noted in the brain/brain metastases.  See discussion below  #Mental status changes-likely secondary to metastatic disease to the brain.  CT with contrast shows approximately centimeter enhancing lesion along the atrium of the left ventricle.  Also shows hydrocephalus, which I am clinically suspicious is caused by metastatic disease to the brain causing mental status changes.  Recommendation:  #Had a long discussion with the patient's wife at the bedside regarding progressive disease/especially metastatic disease to the brain-the treatment options are very limited.  Unfortunately, further therapies including whole brain radiation is unlikely to improve patient's quality of life/or life expectancy.  #Patient is currently full code.  Discussed regarding DNR/DNI-which is strongly recommended in the context of his extremely poor prognosis.  Discussed regarding hospice at length. Patient wife is waiting to speak to patient's children-before making decision on DNR/DNI. Wife understands poor prognosis; and she is awaiting meeting with hospice liaison in the morning.  I totally agree with palliative care recommendations for inpatient hospice as patient is "actively dying".  I also reviewed the extensive records from [place] in length summarized as above.  Thank you Dr.Amery for allowing me to participate in the care of your pleasant patient. Please do not hesitate to contact me with questions or concerns in the interim.

## 2020-11-29 NOTE — Progress Notes (Signed)
[facility] PROGRESS NOTE    Luis Aguilar  [id number] DOB: January 17, 1956 DOA: 11/26/2020 PCP: Dion Body, MD      Brief Narrative:  Luis Aguilar is a 64 y.o. 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  Evidently patient has had progressive bilateral lower extremity weakness over several weeks, went to see his oncologist recently who recommended an MRI which was expected to be performed soon.  However in the interim, the patient got progressively more weak, and then on the day of admission, he fell multiple times, and appeared disoriented so family brought him to the ER.  In the ER, urinalysis unremarkable, hyperglycemia was noted.  BUN and creatinine electrolytes and WBC normal, ammonia normal.  No fever.  CT head unremarkable.  He was given dextrose and the hospitalist service were asked to evaluate for acute metabolic encephalopathy.    8/17- pt very confused for me.  8/18- CT head with mets.  Finally      Assessment & Plan:  Acute metabolic encephalopathy CT head normal.  UDS recently positive for cocaine.  B12, TSH and ammonia unremarkable. 8/18 -still confused  CT with contrast with mets Oncology consulted Spoke to family wife is moving towards hospice.  Discuss changing CODE STATUS to DNR she wanted to talk to his son first.    Severe protein calorie malnutrition Failure to thrive Palliative following Evidence by diffuse loss of subcutaneous muscle mass and fat as well as 20% weight loss in the last 3 months    Hypokalemia Was replaced Monitor periodically   Metastatic prostate cancer I have reached out to Dr. Aline Brochure at [facility], awaiting a callback to coordinate care 8/18-evidence of brain mets on CT of the head Continue with  dexamethasone Continue Flomax Oncology consulted   Bilateral leg weakness MRI lumbar spine recommended recently by Oncology to evaluate for tumor burden.  This was obtained here and was negative for epidural tumor or cord gompression  Hyponatremia Mild, doubt symptoms are from this improving  Anemia of chemotherapy Hgb stable  Hypertension Blood pressure normal - Hold enalapril  Type 2 diabetes -Continue home aspirin  GERD - Continue pantoprazole  Type 2 diabetes Hypoglycemic here, not eating muhc -Hold metformin - Check CBG BID - If hyperglycemic, will need SSI          Disposition: Status is: Inpatient  Remains inpatient appropriate because:Altered mental status  Dispo: The patient is from: Home              Anticipated d/c is to: TBD              Patient currently is not medically stable to d/c.   Difficult to place patient No       Level of care: Med-Surg       MDM: The below labs and imaging reports were reviewed and summarized above.  Medication management as above.    DVT prophylaxis: enoxaparin (LOVENOX) injection 40 mg Start: 11/27/20 1000 Place TED hose Start: 11/26/20 1857  Code Status: FULL Family Communication: updated wife     Procedures:  8/15 CT head -- NAICP 8/16 MR lumbar spine -- no new osseous disease, no cord compression          Subject: Confused.    Objective: Vitals:   11/28/20 1518 11/28/20 2011 11/29/20  0431 11/29/20 0726  BP: (!) 134/94 (!) 132/94 (!) 135/95 (!) 132/97  Pulse: 85 99 (!) 102 (!) 101  Resp: '15 14 19 14  '$ Temp: 98.3 F (36.8 C) 98 F (36.7 C) 98.5 F (36.9 C) 98.2 F (36.8 C)  TempSrc:   Oral   SpO2: 96% 100% 100% 100%  Weight:      Height:        Intake/Output Summary (Last 24 hours) at 11/29/2020 1344 Last data filed at 11/28/2020 2011 Gross per 24 hour  Intake --  Output 0 ml  Net 0 ml   Filed Weights   11/26/20 1418 11/26/20 1423 11/27/20 0809  Weight: 72.6 kg  72.6 kg 67.2 kg    Examination: Nad, calm, in mittens Confused. Not oriented to any thing Cta no w/r Regular s1/s2 no gallop Soft benign +bs No edema   Data Reviewed: I have personally reviewed following labs and imaging studies:  CBC: Recent Labs  Lab 11/26/20 1439 11/27/20 0210 11/28/20 0340  WBC 11.9* 11.7* 10.5  HGB 11.4* 10.7* 10.8*  HCT 34.2* 31.6* 32.2*  MCV 89.1 89.5 89.4  PLT 236 220 [id number]   Basic Metabolic Panel: Recent Labs  Lab 11/26/20 1439 11/27/20 0210 11/28/20 0340  NA 130* 129* 130*  K 4.0 3.5 3.2*  CL 97* 97* 96*  CO2 '25 25 24  '$ GLUCOSE 55* 106* 126*  BUN '20 18 16  '$ CREATININE 0.92 0.88 0.81  CALCIUM 9.2 8.6* 9.3   GFR: Estimated Creatinine Clearance: 87.6 mL/min (by C-G formula based on SCr of 0.81 mg/dL). Liver Function Tests: Recent Labs  Lab 11/26/20 1439  AST 60*  ALT 57*  ALKPHOS 100  BILITOT 1.3*  PROT 7.1  ALBUMIN 3.5   No results for input(s): LIPASE, AMYLASE in the last 168 hours. Recent Labs  Lab 11/26/20 1713  AMMONIA <9*   Coagulation Profile: No results for input(s): INR, PROTIME in the last 168 hours. Cardiac Enzymes: No results for input(s): CKTOTAL, CKMB, CKMBINDEX, TROPONINI in the last 168 hours. BNP (last 3 results) No results for input(s): PROBNP in the last 8760 hours. HbA1C: No results for input(s): HGBA1C in the last 72 hours. CBG: Recent Labs  Lab 11/26/20 1740 11/27/20 2110 11/28/20 0805 11/28/20 2131 11/29/20 0728  GLUCAP 182* 148* 103* 150* 163*   Lipid Profile: No results for input(s): CHOL, HDL, LDLCALC, TRIG, CHOLHDL, LDLDIRECT in the last 72 hours. Thyroid Function Tests: Recent Labs    11/26/20 1713  TSH 0.918   Anemia Panel: Recent Labs    11/26/20 2131  VITAMINB12 946*   Urine analysis:    Component Value Date/Time   COLORURINE YELLOW (A) 11/21/2020 1659   APPEARANCEUR CLOUDY (A) 11/21/2020 1659   APPEARANCEUR Clear 01/21/2013 1758   LABSPEC 1.024 11/21/2020 1659    LABSPEC 1.025 09/02/2013 1144   PHURINE 5.0 11/21/2020 1659   GLUCOSEU NEGATIVE 11/21/2020 1659   GLUCOSEU NEGATIVE 09/02/2013 1144   HGBUR NEGATIVE 11/21/2020 1659   BILIRUBINUR NEGATIVE 11/21/2020 1659   BILIRUBINUR Negative 01/21/2013 1758   KETONESUR 5 (A) 11/21/2020 1659   PROTEINUR NEGATIVE 11/21/2020 1659   NITRITE NEGATIVE 11/21/2020 1659   LEUKOCYTESUR NEGATIVE 11/21/2020 1659   LEUKOCYTESUR Negative 01/21/2013 1758   Sepsis Labs: '@LABRCNTIP'$ (procalcitonin:4,lacticacidven:4)  ) Recent Results (from the past 240 hour(s))  Resp Panel by RT-PCR (Flu A&B, Covid) Nasopharyngeal Swab     Status: None   Collection Time: 11/26/20  5:44 PM   Specimen: Nasopharyngeal Swab; Nasopharyngeal(NP) swabs in vial transport  medium  Result Value Ref Range Status   SARS Coronavirus 2 by RT PCR NEGATIVE NEGATIVE Final    Comment: (NOTE) SARS-CoV-2 target nucleic acids are NOT DETECTED.  The SARS-CoV-2 RNA is generally detectable in upper respiratory specimens during the acute phase of infection. The lowest concentration of SARS-CoV-2 viral copies this assay can detect is 138 copies/mL. A negative result does not preclude SARS-Cov-2 infection and should not be used as the sole basis for treatment or other patient management decisions. A negative result may occur with  improper specimen collection/handling, submission of specimen other than nasopharyngeal swab, presence of viral mutation(s) within the areas targeted by this assay, and inadequate number of viral copies(<138 copies/mL). A negative result must be combined with clinical observations, patient history, and epidemiological information. The expected result is Negative.  Fact Sheet for Patients:  EntrepreneurPulse.com.au  Fact Sheet for Healthcare Providers:  IncredibleEmployment.be  This test is no t yet approved or cleared by the [place] FDA and  has been authorized for detection and/or  diagnosis of SARS-CoV-2 by FDA under an Emergency Use Authorization (EUA). This EUA will remain  in effect (meaning this test can be used) for the duration of the COVID-19 declaration under Section 564(b)(1) of the Act, 21 U.S.C.section 360bbb-3(b)(1), unless the authorization is terminated  or revoked sooner.       Influenza A by PCR NEGATIVE NEGATIVE Final   Influenza B by PCR NEGATIVE NEGATIVE Final    Comment: (NOTE) The Xpert Xpress SARS-CoV-2/FLU/RSV plus assay is intended as an aid in the diagnosis of influenza from Nasopharyngeal swab specimens and should not be used as a sole basis for treatment. Nasal washings and aspirates are unacceptable for Xpert Xpress SARS-CoV-2/FLU/RSV testing.  Fact Sheet for Patients: EntrepreneurPulse.com.au  Fact Sheet for Healthcare Providers: IncredibleEmployment.be  This test is not yet approved or cleared by the [place] FDA and has been authorized for detection and/or diagnosis of SARS-CoV-2 by FDA under an Emergency Use Authorization (EUA). This EUA will remain in effect (meaning this test can be used) for the duration of the COVID-19 declaration under Section 564(b)(1) of the Act, 21 U.S.C. section 360bbb-3(b)(1), unless the authorization is terminated or revoked.  Performed at [facility], [address] [place], [place] [address]          Radiology Studies: CT HEAD WO CONTRAST (5MM)  Result Date: 11/28/2020 CLINICAL DATA:  Mental status change, unknown cause. Metastatic small cell carcinoma of the prostate. EXAM: CT HEAD WITHOUT CONTRAST TECHNIQUE: Contiguous axial images were obtained from the base of the skull through the vertex without intravenous contrast. COMPARISON:  Head CT 11/26/2020 and multiple previous FINDINGS: Brain: No focal abnormality affects the brainstem. Old left cerebellar infarction. Cerebral hemispheres show atrophy with chronic small-vessel ischemic  changes of the white matter. Ventriculomegaly as shown on the recent prior exams, increase since 2014. The differential diagnosis is central atrophy versus normal pressure hydrocephalus. No changes demonstrable over the last 2 studies. No evidence of hemorrhage or extra-axial collection. Vascular: There is atherosclerotic calcification of the major vessels at the base of the brain. Skull: No evidence of lytic or sclerotic metastatic disease. Sinuses/Orbits: No significant sinus inflammatory finding. Orbits negative. Other: Shot present within the soft tissues of the left frontotemporal scalp. IMPRESSION: No change since the last 2 studies. Mild chronic small-vessel change of the hemispheric white matter. No other focal brain finding. Ventriculomegaly, increased since 2014. This could be due to central atrophy or normal pressure hydrocephalus. Electronically  Signed   By: Nelson Chimes M.D.   On: 11/28/2020 12:34   CT HEAD W CONTRAST (5MM)  Result Date: 11/28/2020 CLINICAL DATA:  Metastatic disease evaluation. Cancer history and mental status changes. EXAM: CT HEAD WITH CONTRAST TECHNIQUE: Contiguous axial images were obtained from the base of the skull through the vertex with intravenous contrast. CONTRAST:  84m OMNIPAQUE IOHEXOL 350 MG/ML SOLN COMPARISON:  Earlier same day.  2 days ago. FINDINGS: Brain: Cerebellar atrophy and old left cerebellar stroke. Redemonstration of dilated ventricular system, which could relate to central atrophy or true hydrocephalus. There is a 9 x 6 x 6 mm enhancing lesion within the white matter adjacent to the atrium of the left lateral ventricle. There is enhancement of the lateral ependymal wall adjacent to this. Parenchymal lesion is quite likely to be a metastatic lesion. The ependymal enhancement could herald the presence diffuse CSF metastatic disease. Vascular: Enhancement of the major vessels at base of the brain and of the major venous sinuses. Skull: Normal Sinuses/Orbits:  Clear/normal Other: None IMPRESSION: 9 x 6 x 6 mm enhancing lesion within the white matter adjacent to the atrium of the left lateral ventricle consistent with a metastasis. Probable enhancement the ependymal wall the lateral atrial on the left adjacent to this. This could herald the presence of diffuse CSF disease. Redemonstration of dilated ventricular system which could indicate true hydrocephalus or be secondary to central atrophy. Electronically Signed   By: MNelson ChimesM.D.   On: 11/28/2020 14:10   MR Lumbar Spine W Wo Contrast  Result Date: 11/27/2020 CLINICAL DATA:  Metastatic prostate cancer.  Altered mental status. EXAM: MRI LUMBAR SPINE WITHOUT AND WITH CONTRAST TECHNIQUE: Multiplanar and multiecho pulse sequences of the lumbar spine were obtained without and with intravenous contrast. CONTRAST:  649mGADAVIST GADOBUTROL 1 MMOL/ML IV SOLN COMPARISON:  One view abdomen 11/26/2020. Abdominopelvic CT 10/18/2020, 01/19/2020 and 08/01/2018. FINDINGS: Segmentation: Transitional lumbosacral anatomy. As correlated with prior studies, there are 12 rib-bearing thoracic type vertebral bodies, 5 lumbar type vertebral bodies and a largely lumbarized S1 segment. Alignment:  Physiologic. Vertebrae: As demonstrated on recent CTs, there are multiple sclerotic lesions within the lumbar vertebral bodies which are new from 2020 and consistent with metastases. Largest lesion involving the right aspect of the L3 vertebral body and its right pedicle measures up to 3.6 cm on image 5/10. Other prominent lesions are present within the left aspects of the L1 and L2 vertebral bodies, and anteriorly within the superior endplate of L4. Stable chronic pathologic fracture at L3. There is probable mild associated enhancement following contrast, although assessment is limited by motion. No new fracture or epidural tumor identified. Conus medullaris: Extends to the L1 level and appears normal. No abnormal intradural enhancement.  Paraspinal and other soft tissues: No significant paraspinal findings. Bilateral adrenal nodules are grossly unchanged from 2019 and likely adenomas. The bladder is mildly distended. Stable heterogeneity of the visualized liver. Disc levels: L1-2: Normal interspace. L2-3: Mild disc bulging and facet hypertrophy. No significant spinal stenosis or nerve root encroachment. L3-4: Mild disc bulging and bilateral facet hypertrophy. No significant spinal stenosis or nerve root encroachment. L4-5: Mild disc bulging and bilateral facet hypertrophy. No significant spinal stenosis or nerve root encroachment. L5-S1: Mild disc bulging and small central disc protrusion. Mild bilateral facet hypertrophy. No significant spinal stenosis or nerve root encroachment. S1-2: Nearly fully formed transitional disc space level. There is annular disc bulging eccentric to the left with moderate facet hypertrophy, worse on the  right. No significant spinal stenosis or nerve root encroachment. IMPRESSION: 1. No significant change in multifocal osseous metastatic disease compared with recent prior CTs (allowing for sensitivity differences between modalities). Chronic mild pathologic fracture at L3. 2. No evidence of epidural tumor or acute osseous findings. 3. Mild multilevel spondylosis without significant spinal stenosis or nerve root encroachment. 4. Grossly stable bilateral adrenal nodules, likely adenomas based on stability. Electronically Signed   By: Richardean Sale M.D.   On: 11/27/2020 14:52        Scheduled Meds:  aspirin EC  81 mg Oral Daily   dexamethasone  2 mg Oral BID   enoxaparin (LOVENOX) injection  40 mg Subcutaneous Q24H   feeding supplement  237 mL Oral BID BM   multivitamin with minerals  1 tablet Oral Daily   pantoprazole  40 mg Oral Daily   Continuous Infusions:   LOS: 2 days    Time spent: 35 minutes  min with >50% on coc    Nolberto Hanlon, MD [facility] 11/29/2020, 1:44 PM     Please page  though Shea Evans or Epic secure chat:  For [employer], Adult nurse

## 2020-11-30 LAB — GLUCOSE, CAPILLARY: Glucose-Capillary: 200 mg/dL — ABNORMAL HIGH (ref 70–99)

## 2020-11-30 MED ORDER — ACETAMINOPHEN 500 MG PO TABS: 1000.0000 mg | ORAL_TABLET | Freq: Four times a day (QID) | ORAL | 0 refills | Status: AC | PRN

## 2020-11-30 MED ORDER — ACETAMINOPHEN 650 MG RE SUPP: 650.0000 mg | Freq: Four times a day (QID) | RECTAL | 0 refills | Status: AC | PRN

## 2020-11-30 NOTE — Progress Notes (Signed)
[facility] PROGRESS NOTE    EFFREM DIBB  [id number] DOB: 12-Apr-1956 DOA: 11/26/2020 PCP: Dion Body, MD      Brief Narrative:  Mr. Bielec is a 65 y.o. 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  Evidently patient has had progressive bilateral lower extremity weakness over several weeks, went to see his oncologist recently who recommended an MRI which was expected to be performed soon.  However in the interim, the patient got progressively more weak, and then on the day of admission, he fell multiple times, and appeared disoriented so family brought him to the ER.  In the ER, urinalysis unremarkable, hyperglycemia was noted.  BUN and creatinine electrolytes and WBC normal, ammonia normal.  No fever.  CT head unremarkable.  He was given dextrose and the hospitalist service were asked to evaluate for acute metabolic encephalopathy.    8/17- pt very confused for me.  8/18- CT head with mets.  Finally 8/19 still confused.  Family made patient DNR.  Have decided to move forward with hospice house.     Assessment & Plan:  Acute metabolic encephalopathy CT head normal.  UDS recently positive for cocaine.  B12, TSH and ammonia unremarkable. CT with contrast with mets 8/19 oncology's input was greatly appreciated.  Spoke to family about patient's situation and not a great candidate for further therapy as whole brain radiation is unlikely to improve patient's quality of life and expectancy. Encephalopathy likely due to metastatic disease to the brain. Now patient is comfort going to hospice house when bed available. CODE STATUS changed to DNR  Severe protein calorie malnutrition Failure to thrive Evidence by diffuse loss of subcutaneous muscle mass and fat as well as  20% weight loss in the last 3 months  8/19-plan to go to hospice house.  Feeding as tolerated  Hypokalemia Was replaced Monitor periodically 8/19 not necessary to recheck as patient going to go to hospice house   Metastatic prostate cancer I have reached out to Dr. Aline Brochure at [facility], awaiting a callback to coordinate care evidence of brain mets on CT of the head Continue with dexamethasone Continue Flomax 8/19 plan to go to hospice house   Bilateral leg weakness MRI lumbar spine recommended recently by Oncology to evaluate for tumor burden.  This was obtained here and was negative for epidural tumor or cord gompression  Hyponatremia Mild, doubt symptoms are from this improving  Anemia of chemotherapy Hgb stable  Hypertension Blood pressure normal - Hold enalapril  Type 2 diabetes -Continue home aspirin  GERD - Continue pantoprazole  Type 2 diabetes Hypoglycemic here, not eating muhc -Hold metformin - Check CBG BID - If hyperglycemic, will need SSI          Disposition: Status is: Inpatient  Remains inpatient appropriate because:Altered mental status  Dispo: The patient is from: Home              Anticipated d/c is to: Hospice house when bed is available              Patient currently is not medically stable to d/c.   Difficult to place patient No       Level of care: Med-Surg       MDM: The below labs and imaging reports were reviewed and summarized above.  Medication management  as above.    DVT prophylaxis: enoxaparin (LOVENOX) injection 40 mg Start: 11/27/20 1000 Place TED hose Start: 11/26/20 1857  Code Status: FULL Family Communication: updated wife     Procedures:  8/15 CT head -- NAICP 8/16 MR lumbar spine -- no new osseous disease, no cord compression          Subject: Drinking his shake.  Confused minimal conversation with me  Objective: Vitals:   11/29/20 1511 11/29/20 1958 11/30/20 0409 11/30/20  0737  BP: (!) 133/93 (!) 128/95 (!) 121/92 (!) 132/91  Pulse: (!) 110 (!) 107 (!) 107 (!) 107  Resp: '15 19 20 14  '$ Temp: 98.2 F (36.8 C) 98.5 F (36.9 C) 98 F (36.7 C) 98 F (36.7 C)  TempSrc:      SpO2:  100% 95%   Weight:      Height:        Intake/Output Summary (Last 24 hours) at 11/30/2020 0834 Last data filed at 11/30/2020 0334 Gross per 24 hour  Intake 120 ml  Output --  Net 120 ml   Filed Weights   11/26/20 1418 11/26/20 1423 11/27/20 0809  Weight: 72.6 kg 72.6 kg 67.2 kg    Examination: NAD calm CTA no wheeze rales rhonchi's anteriorly Regular S1-S2 no gallops Soft benign positive bowel sounds No edema   Data Reviewed: I have personally reviewed following labs and imaging studies:  CBC: Recent Labs  Lab 11/26/20 1439 11/27/20 0210 11/28/20 0340  WBC 11.9* 11.7* 10.5  HGB 11.4* 10.7* 10.8*  HCT 34.2* 31.6* 32.2*  MCV 89.1 89.5 89.4  PLT 236 220 [id number]   Basic Metabolic Panel: Recent Labs  Lab 11/26/20 1439 11/27/20 0210 11/28/20 0340  NA 130* 129* 130*  K 4.0 3.5 3.2*  CL 97* 97* 96*  CO2 '25 25 24  '$ GLUCOSE 55* 106* 126*  BUN '20 18 16  '$ CREATININE 0.92 0.88 0.81  CALCIUM 9.2 8.6* 9.3   GFR: Estimated Creatinine Clearance: 87.6 mL/min (by C-G formula based on SCr of 0.81 mg/dL). Liver Function Tests: Recent Labs  Lab 11/26/20 1439  AST 60*  ALT 57*  ALKPHOS 100  BILITOT 1.3*  PROT 7.1  ALBUMIN 3.5   No results for input(s): LIPASE, AMYLASE in the last 168 hours. Recent Labs  Lab 11/26/20 1713  AMMONIA <9*   Coagulation Profile: No results for input(s): INR, PROTIME in the last 168 hours. Cardiac Enzymes: No results for input(s): CKTOTAL, CKMB, CKMBINDEX, TROPONINI in the last 168 hours. BNP (last 3 results) No results for input(s): PROBNP in the last 8760 hours. HbA1C: No results for input(s): HGBA1C in the last 72 hours. CBG: Recent Labs  Lab 11/27/20 2110 11/28/20 0805 11/28/20 2131 11/29/20 0728 11/30/20 0739   GLUCAP 148* 103* 150* 163* 200*   Lipid Profile: No results for input(s): CHOL, HDL, LDLCALC, TRIG, CHOLHDL, LDLDIRECT in the last 72 hours. Thyroid Function Tests: No results for input(s): TSH, T4TOTAL, FREET4, T3FREE, THYROIDAB in the last 72 hours.  Anemia Panel: No results for input(s): VITAMINB12, FOLATE, FERRITIN, TIBC, IRON, RETICCTPCT in the last 72 hours.  Urine analysis:    Component Value Date/Time   COLORURINE YELLOW (A) 11/21/2020 1659   APPEARANCEUR CLOUDY (A) 11/21/2020 1659   APPEARANCEUR Clear 01/21/2013 1758   LABSPEC 1.024 11/21/2020 1659   LABSPEC 1.025 09/02/2013 1144   PHURINE 5.0 11/21/2020 1659   GLUCOSEU NEGATIVE 11/21/2020 1659   GLUCOSEU NEGATIVE 09/02/2013 1144   HGBUR NEGATIVE 11/21/2020 1659   BILIRUBINUR NEGATIVE  11/21/2020 1659   BILIRUBINUR Negative 01/21/2013 1758   KETONESUR 5 (A) 11/21/2020 1659   PROTEINUR NEGATIVE 11/21/2020 1659   NITRITE NEGATIVE 11/21/2020 1659   LEUKOCYTESUR NEGATIVE 11/21/2020 1659   LEUKOCYTESUR Negative 01/21/2013 1758   Sepsis Labs: '@LABRCNTIP'$ (procalcitonin:4,lacticacidven:4)  ) Recent Results (from the past 240 hour(s))  Resp Panel by RT-PCR (Flu A&B, Covid) Nasopharyngeal Swab     Status: None   Collection Time: 11/26/20  5:44 PM   Specimen: Nasopharyngeal Swab; Nasopharyngeal(NP) swabs in vial transport medium  Result Value Ref Range Status   SARS Coronavirus 2 by RT PCR NEGATIVE NEGATIVE Final    Comment: (NOTE) SARS-CoV-2 target nucleic acids are NOT DETECTED.  The SARS-CoV-2 RNA is generally detectable in upper respiratory specimens during the acute phase of infection. The lowest concentration of SARS-CoV-2 viral copies this assay can detect is 138 copies/mL. A negative result does not preclude SARS-Cov-2 infection and should not be used as the sole basis for treatment or other patient management decisions. A negative result may occur with  improper specimen collection/handling, submission of  specimen other than nasopharyngeal swab, presence of viral mutation(s) within the areas targeted by this assay, and inadequate number of viral copies(<138 copies/mL). A negative result must be combined with clinical observations, patient history, and epidemiological information. The expected result is Negative.  Fact Sheet for Patients:  EntrepreneurPulse.com.au  Fact Sheet for Healthcare Providers:  IncredibleEmployment.be  This test is no t yet approved or cleared by the [place] FDA and  has been authorized for detection and/or diagnosis of SARS-CoV-2 by FDA under an Emergency Use Authorization (EUA). This EUA will remain  in effect (meaning this test can be used) for the duration of the COVID-19 declaration under Section 564(b)(1) of the Act, 21 U.S.C.section 360bbb-3(b)(1), unless the authorization is terminated  or revoked sooner.       Influenza A by PCR NEGATIVE NEGATIVE Final   Influenza B by PCR NEGATIVE NEGATIVE Final    Comment: (NOTE) The Xpert Xpress SARS-CoV-2/FLU/RSV plus assay is intended as an aid in the diagnosis of influenza from Nasopharyngeal swab specimens and should not be used as a sole basis for treatment. Nasal washings and aspirates are unacceptable for Xpert Xpress SARS-CoV-2/FLU/RSV testing.  Fact Sheet for Patients: EntrepreneurPulse.com.au  Fact Sheet for Healthcare Providers: IncredibleEmployment.be  This test is not yet approved or cleared by the [place] FDA and has been authorized for detection and/or diagnosis of SARS-CoV-2 by FDA under an Emergency Use Authorization (EUA). This EUA will remain in effect (meaning this test can be used) for the duration of the COVID-19 declaration under Section 564(b)(1) of the Act, 21 U.S.C. section 360bbb-3(b)(1), unless the authorization is terminated or revoked.  Performed at [facility], [address] [place], [place] [address]          Radiology Studies: CT HEAD WO CONTRAST (5MM)  Result Date: 11/28/2020 CLINICAL DATA:  Mental status change, unknown cause. Metastatic small cell carcinoma of the prostate. EXAM: CT HEAD WITHOUT CONTRAST TECHNIQUE: Contiguous axial images were obtained from the base of the skull through the vertex without intravenous contrast. COMPARISON:  Head CT 11/26/2020 and multiple previous FINDINGS: Brain: No focal abnormality affects the brainstem. Old left cerebellar infarction. Cerebral hemispheres show atrophy with chronic small-vessel ischemic changes of the white matter. Ventriculomegaly as shown on the recent prior exams, increase since 2014. The differential diagnosis is central atrophy versus normal pressure hydrocephalus. No changes demonstrable over the last 2 studies. No evidence  of hemorrhage or extra-axial collection. Vascular: There is atherosclerotic calcification of the major vessels at the base of the brain. Skull: No evidence of lytic or sclerotic metastatic disease. Sinuses/Orbits: No significant sinus inflammatory finding. Orbits negative. Other: Shot present within the soft tissues of the left frontotemporal scalp. IMPRESSION: No change since the last 2 studies. Mild chronic small-vessel change of the hemispheric white matter. No other focal brain finding. Ventriculomegaly, increased since 2014. This could be due to central atrophy or normal pressure hydrocephalus. Electronically Signed   By: Nelson Chimes M.D.   On: 11/28/2020 12:34   CT HEAD W CONTRAST (5MM)  Result Date: 11/28/2020 CLINICAL DATA:  Metastatic disease evaluation. Cancer history and mental status changes. EXAM: CT HEAD WITH CONTRAST TECHNIQUE: Contiguous axial images were obtained from the base of the skull through the vertex with intravenous contrast. CONTRAST:  48m OMNIPAQUE IOHEXOL 350 MG/ML SOLN COMPARISON:  Earlier same day.  2 days ago. FINDINGS: Brain: Cerebellar atrophy and old  left cerebellar stroke. Redemonstration of dilated ventricular system, which could relate to central atrophy or true hydrocephalus. There is a 9 x 6 x 6 mm enhancing lesion within the white matter adjacent to the atrium of the left lateral ventricle. There is enhancement of the lateral ependymal wall adjacent to this. Parenchymal lesion is quite likely to be a metastatic lesion. The ependymal enhancement could herald the presence diffuse CSF metastatic disease. Vascular: Enhancement of the major vessels at base of the brain and of the major venous sinuses. Skull: Normal Sinuses/Orbits: Clear/normal Other: None IMPRESSION: 9 x 6 x 6 mm enhancing lesion within the white matter adjacent to the atrium of the left lateral ventricle consistent with a metastasis. Probable enhancement the ependymal wall the lateral atrial on the left adjacent to this. This could herald the presence of diffuse CSF disease. Redemonstration of dilated ventricular system which could indicate true hydrocephalus or be secondary to central atrophy. Electronically Signed   By: MNelson ChimesM.D.   On: 11/28/2020 14:10        Scheduled Meds:  aspirin EC  81 mg Oral Daily   dexamethasone  2 mg Oral BID   enoxaparin (LOVENOX) injection  40 mg Subcutaneous Q24H   feeding supplement  237 mL Oral BID BM   multivitamin with minerals  1 tablet Oral Daily   pantoprazole  40 mg Oral Daily   Continuous Infusions:   LOS: 3 days    Time spent: 35 minutes  min with >50% on coc    SNolberto Hanlon MD [facility] 11/30/2020, 8:34 AM     Please page though [employer] Epic secure chat:  For [employer] cAdult nurse

## 2020-11-30 NOTE — Progress Notes (Signed)
PT Cancellation Note  Patient Details Name: Luis Aguilar MRN: [id number] DOB: Jun 10, 1955   Cancelled Treatment:    Reason Eval/Treat Not Completed: Other (comment): Per medical team pt transitioning to comfort care with PT services to be competed at this time.     Linus Salmons PT, DPT 11/30/20, 1:55 PM

## 2020-11-30 NOTE — TOC Progression Note (Signed)
Transition of Care [facility]) - Progression Note    Patient Details  Name: RODRIQUES THEROUX MRN: [id number] Date of Birth: 11/30/1955  Transition of [facility]) CM/SW [place], RN Phone Number: 11/30/2020, 3:07 PM  Clinical Narrative:   Patient to be transferred to [place] at 9pm as per [facility]           Expected Discharge Plan and Services                                                 Social Determinants of Health (SDOH) Interventions    Readmission Risk Interventions No flowsheet data found.

## 2020-11-30 NOTE — Progress Notes (Addendum)
[facility]) Hospital Liaison RN Note  Received request from Dreama Saa, RN [facility] Manager for family interest in [facility]. Spoke with patient's wife, Bari Doorley to confirm interest and explain services.   Chart and patient information reviewed by [facility] physician. Hospice Home eligibility confirmed.   Hospice Home is able to offer a room today. Family agreeable to transfer today.   Please send signed and completed DNR with patient at discharge.   Nurse please call report to [place] at [phone].  Please do not hesitate to call with any hospice related questions.   Thank you for the opportunity to participate in this patient's care.   Bobbie "Loren Racer, RN, BSN [facility] Liaison [phone]

## 2020-11-30 NOTE — Plan of Care (Signed)
No acute events this shift. Problem: Pain Managment: Goal: General experience of comfort will improve Outcome: Progressing   Problem: Safety: Goal: Ability to remain free from injury will improve Outcome: Progressing   Problem: Skin Integrity: Goal: Risk for impaired skin integrity will decrease Outcome: Progressing

## 2020-11-30 NOTE — Progress Notes (Signed)
Physical Therapy Treatment Patient Details Name: Luis Aguilar MRN: [id number] DOB: 1955/09/29 Today's Date: 11/30/2020    History of Present Illness Per MD notes, pt is a 65 y.o. male who presented with failure to thrive, confusion and falls. PMH includes small cell carcinoma prostate cancer with metastasis to to lymph nodes, bone, adrenals, and liver, hypertension, hyperlipidemia, anemia, non-insulin-dependent diabetes mellitus, GERD, and multiple falls. MD assessment includes acute metabolic encephalopathy, metastatic prostate cancer, BLE weakness, hyponatremia, and type 2 diabetes.    PT Comments    Pt was willing to participate during the session.  Pt was alert with flat affect during session and had difficulty following simple directions despite frequent multimodal cueing. Pt required mod A +2 for bed mobility and max multimodal cueing for sequencing and directions. Transfers attempted but he was unable to maintain upright posture without mod-max A. Pt only able to provide minimal effort with min muscle activation during therex. Ambulation and OOB activities will be attempted next visit as appropriate. Pt is limited with functional mobility secondary to generalized weakness and fatigue. Pt will benefit from PT services in a SNF setting upon discharge to safely address deficits listed in patient problem list for decreased caregiver assistance and eventual return to PLOF.   Follow Up Recommendations  SNF;Supervision/Assistance - 24 hour     Equipment Recommendations  Other (comment) (TBD at next venue of care)    Recommendations for Other Services       Precautions / Restrictions Precautions Precautions: Fall Restrictions Weight Bearing Restrictions: No    Mobility  Bed Mobility Overal bed mobility: Needs Assistance Bed Mobility: Supine to Sit;Sit to Supine     Supine to sit: Mod assist;+2 for physical assistance;HOB elevated Sit to supine: Mod assist;+2 for physical  assistance   General bed mobility comments: Mod A +2 for LEs and trunk and max verbal cueing for sequencing and directions.    Transfers                 General transfer comment: Unable secondary to inability to maintain upright sitting posture without mod-max A  Ambulation/Gait                 Stairs             Wheelchair Mobility    Modified Rankin (Stroke Patients Only)       Balance Overall balance assessment: Needs assistance Sitting-balance support: Bilateral upper extremity supported Sitting balance-Leahy Scale: Poor Sitting balance - Comments: Required mod-max A to prevent LOB posteriorly while sitting at EOB Postural control: Posterior lean     Standing balance comment: Unable to assess                            Cognition Arousal/Alertness: Awake/alert Behavior During Therapy: Flat affect Overall Cognitive Status: No family/caregiver present to determine baseline cognitive functioning                                 General Comments: Pt was alert but mostly limited to 1-2 word responses and difficulty following commands despite frequent multimodal cueing.      Exercises Total Joint Exercises Short Arc Quad: AAROM;Strengthening;Both;10 reps;Supine;Other (comment) (Pt demonstrated minimal muscle activation; more difficulty with R LE) Hip ABduction/ADduction: Strengthening;Both;10 reps;AAROM;Supine;Other (comment) (Pt demonstrated minimal muscle activation; more difficulty with R LE) Other Exercises Other Exercises: Static sitting 1-2 min requiring mod-max  A for improved trunk control and balance    General Comments        Pertinent Vitals/Pain Pain Assessment: No/denies pain    Home Living                      Prior Function            PT Goals (current goals can now be found in the care plan section) Progress towards PT goals: Progressing toward goals    Frequency    Min 2X/week       PT Plan Current plan remains appropriate    Co-evaluation              AM-PAC PT "6 Clicks" Mobility   Outcome Measure  Help needed turning from your back to your side while in a flat bed without using bedrails?: A Lot Help needed moving from lying on your back to sitting on the side of a flat bed without using bedrails?: A Lot Help needed moving to and from a bed to a chair (including a wheelchair)?: Total Help needed standing up from a chair using your arms (e.g., wheelchair or bedside chair)?: Total Help needed to walk in hospital room?: Total Help needed climbing 3-5 steps with a railing? : Total 6 Click Score: 8    End of Session   Activity Tolerance: Patient limited by fatigue Patient left: in bed;with call bell/phone within reach;with bed alarm set;with restraints reapplied;Other (comment) (floating heels via pillow) Nurse Communication: Mobility status PT Visit Diagnosis: Unsteadiness on feet (R26.81);Muscle weakness (generalized) (M62.81);History of falling (Z91.81);Difficulty in walking, not elsewhere classified (R26.2);Repeated falls (R29.6)     Time: [id number] PT Time Calculation (min) (ACUTE ONLY): 26 min  Charges:                        Dayton Scrape SPT 11/30/20, 1:54 PM

## 2020-11-30 NOTE — Progress Notes (Signed)
Palliative: Mr. Orfield has failure to thrive.  His wife of 2 years, Blanch Media, has elected comfort and dignity, let nature take its course and residential hospice.  She has chosen [facility]. in [place].  Mr. Queenan has been evaluated by hospice and has been accepted into their facility.  Conference with hospice liaison.  DNR/goldenrod form completed.  Plan: Comfort and dignity at end-of-life, residential hospice with [facility] to let nature take its course.  15 minutes Quinn Axe, NP Palliative medicine team Team phone [phone] Greater than 50% of this time was spent counseling and coordinating care related to the above assessment and plan.
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
## 2020-12-13 DEATH — deceased
